# Patient Record
Sex: Female | Born: 1950 | Race: Black or African American | Hispanic: No | Marital: Married | Smoking: Never smoker
Health system: Southern US, Community
[De-identification: ages and names within clinical notes are randomized; demographics above are authoritative.]

## PROBLEM LIST (undated history)

## (undated) DIAGNOSIS — I10 Essential (primary) hypertension: Secondary | ICD-10-CM

## (undated) DIAGNOSIS — E785 Hyperlipidemia, unspecified: Secondary | ICD-10-CM

## (undated) DIAGNOSIS — E1140 Type 2 diabetes mellitus with diabetic neuropathy, unspecified: Secondary | ICD-10-CM

## (undated) DIAGNOSIS — M810 Age-related osteoporosis without current pathological fracture: Secondary | ICD-10-CM

## (undated) DIAGNOSIS — K8020 Calculus of gallbladder without cholecystitis without obstruction: Secondary | ICD-10-CM

## (undated) DIAGNOSIS — R002 Palpitations: Secondary | ICD-10-CM

## (undated) DIAGNOSIS — E119 Type 2 diabetes mellitus without complications: Secondary | ICD-10-CM

## (undated) HISTORY — PX: CHOLECYSTECTOMY: SHX55

## (undated) HISTORY — DX: Age-related osteoporosis without current pathological fracture: M81.0

## (undated) HISTORY — DX: Hyperlipidemia, unspecified: E78.5

## (undated) HISTORY — DX: Palpitations: R00.2

## (undated) HISTORY — DX: Type 2 diabetes mellitus without complications: E11.9

## (undated) HISTORY — DX: Type 2 diabetes mellitus with diabetic neuropathy, unspecified: E11.40

## (undated) HISTORY — DX: Essential (primary) hypertension: I10

## (undated) HISTORY — PX: TUBAL LIGATION: SHX77

---

## 1998-05-19 ENCOUNTER — Ambulatory Visit (HOSPITAL_COMMUNITY): Admission: RE | Admit: 1998-05-19 | Discharge: 1998-05-19 | Payer: Self-pay | Admitting: Family Medicine

## 1998-09-27 ENCOUNTER — Other Ambulatory Visit: Admission: RE | Admit: 1998-09-27 | Discharge: 1998-09-27 | Payer: Self-pay | Admitting: Family Medicine

## 2000-05-05 ENCOUNTER — Encounter: Admission: RE | Admit: 2000-05-05 | Discharge: 2000-05-05 | Payer: Self-pay

## 2003-03-19 ENCOUNTER — Emergency Department (HOSPITAL_COMMUNITY): Admission: AD | Admit: 2003-03-19 | Discharge: 2003-03-20 | Payer: Self-pay | Admitting: Emergency Medicine

## 2010-01-27 ENCOUNTER — Inpatient Hospital Stay (HOSPITAL_COMMUNITY)
Admission: EM | Admit: 2010-01-27 | Discharge: 2010-01-29 | Payer: Self-pay | Source: Home / Self Care | Attending: Cardiology | Admitting: Cardiology

## 2010-02-05 LAB — BASIC METABOLIC PANEL
BUN: 21 mg/dL (ref 6–23)
BUN: 10 mg/dL (ref 6–23)
CO2: 28 mEq/L (ref 19–32)
CO2: 27 mEq/L (ref 19–32)
Calcium: 8.7 mg/dL (ref 8.4–10.5)
Calcium: 8.7 mg/dL (ref 8.4–10.5)
Chloride: 108 mEq/L (ref 96–112)
Chloride: 110 mEq/L (ref 96–112)
Creatinine, Ser: 1.16 mg/dL (ref 0.4–1.2)
Creatinine, Ser: 1 mg/dL (ref 0.4–1.2)
GFR calc Af Amer: 58 mL/min — ABNORMAL LOW (ref >60)
GFR calc Af Amer: >60 mL/min (ref >60)
GFR calc non Af Amer: 48 mL/min — ABNORMAL LOW (ref >60)
GFR calc non Af Amer: 57 mL/min — ABNORMAL LOW (ref >60)
Glucose, Bld: 123 mg/dL — ABNORMAL HIGH (ref 70–99)
Glucose, Bld: 119 mg/dL — ABNORMAL HIGH (ref 70–99)
Potassium: 3.7 mEq/L (ref 3.5–5.1)
Potassium: 3.5 mEq/L (ref 3.5–5.1)
Sodium: 142 mEq/L (ref 135–145)
Sodium: 142 mEq/L (ref 135–145)

## 2010-02-05 LAB — CARDIAC PANEL(CRET KIN+CKTOT+MB+TROPI)
CK, MB: 1.6 ng/mL (ref 0.3–4.0)
CK, MB: 1.3 ng/mL (ref 0.3–4.0)
Relative Index: INVALID (ref 0.0–2.5)
Relative Index: INVALID (ref 0.0–2.5)
Total CK: 72 U/L (ref 7–177)
Total CK: 77 U/L (ref 7–177)
Troponin I: 0.16 ng/mL — ABNORMAL HIGH (ref 0.00–0.06)
Troponin I: 0.09 ng/mL — ABNORMAL HIGH (ref 0.00–0.06)

## 2010-02-05 LAB — CBC
HCT: 44.2 % (ref 36.0–46.0)
HCT: 39.8 % (ref 36.0–46.0)
HCT: 39.5 % (ref 36.0–46.0)
Hemoglobin: 14.5 g/dL (ref 12.0–15.0)
Hemoglobin: 12.7 g/dL (ref 12.0–15.0)
Hemoglobin: 12.9 g/dL (ref 12.0–15.0)
MCH: 28 pg (ref 26.0–34.0)
MCH: 27.7 pg (ref 26.0–34.0)
MCH: 27.9 pg (ref 26.0–34.0)
MCHC: 32.8 g/dL (ref 30.0–36.0)
MCHC: 31.9 g/dL (ref 30.0–36.0)
MCHC: 32.7 g/dL (ref 30.0–36.0)
MCV: 85.3 fL (ref 78.0–100.0)
MCV: 86.9 fL (ref 78.0–100.0)
MCV: 85.5 fL (ref 78.0–100.0)
Platelets: 238 10*3/uL (ref 150–400)
Platelets: 203 10*3/uL (ref 150–400)
Platelets: 203 10*3/uL (ref 150–400)
RBC: 5.18 MIL/uL — ABNORMAL HIGH (ref 3.87–5.11)
RBC: 4.58 MIL/uL (ref 3.87–5.11)
RBC: 4.62 MIL/uL (ref 3.87–5.11)
RDW: 12.9 % (ref 11.5–15.5)
RDW: 12.9 % (ref 11.5–15.5)
RDW: 12.8 % (ref 11.5–15.5)
WBC: 9.8 10*3/uL (ref 4.0–10.5)
WBC: 9 10*3/uL (ref 4.0–10.5)
WBC: 8.5 10*3/uL (ref 4.0–10.5)

## 2010-02-05 LAB — COMPREHENSIVE METABOLIC PANEL
ALT: 23 U/L (ref 0–35)
AST: 26 U/L (ref 0–37)
Albumin: 3.8 g/dL (ref 3.5–5.2)
Alkaline Phosphatase: 69 U/L (ref 39–117)
BUN: 33 mg/dL — ABNORMAL HIGH (ref 6–23)
CO2: 27 mEq/L (ref 19–32)
Calcium: 9.1 mg/dL (ref 8.4–10.5)
Chloride: 100 mEq/L (ref 96–112)
Creatinine, Ser: 1.41 mg/dL — ABNORMAL HIGH (ref 0.4–1.2)
GFR calc Af Amer: 47 mL/min — ABNORMAL LOW (ref >60)
GFR calc non Af Amer: 38 mL/min — ABNORMAL LOW (ref >60)
Glucose, Bld: 163 mg/dL — ABNORMAL HIGH (ref 70–99)
Potassium: 3.6 mEq/L (ref 3.5–5.1)
Sodium: 134 mEq/L — ABNORMAL LOW (ref 135–145)
Total Bilirubin: 0.6 mg/dL (ref 0.3–1.2)
Total Protein: 7.1 g/dL (ref 6.0–8.3)

## 2010-02-05 LAB — APTT: aPTT: 26 seconds (ref 24–37)

## 2010-02-05 LAB — DIFFERENTIAL
Basophils Absolute: 0.1 10*3/uL (ref 0.0–0.1)
Basophils Relative: 1 % (ref 0–1)
Eosinophils Absolute: 0.3 10*3/uL (ref 0.0–0.7)
Eosinophils Relative: 3 % (ref 0–5)
Lymphocytes Relative: 19 % (ref 12–46)
Lymphs Abs: 1.9 10*3/uL (ref 0.7–4.0)
Monocytes Absolute: 0.8 10*3/uL (ref 0.1–1.0)
Monocytes Relative: 8 % (ref 3–12)
Neutro Abs: 6.8 10*3/uL (ref 1.7–7.7)
Neutrophils Relative %: 69 % (ref 43–77)

## 2010-02-05 LAB — LIPASE, BLOOD: Lipase: 50 U/L (ref 11–59)

## 2010-02-05 LAB — HEPARIN LEVEL (UNFRACTIONATED)
Heparin Unfractionated: 0.14 IU/mL — ABNORMAL LOW (ref 0.30–0.70)
Heparin Unfractionated: 0.36 IU/mL (ref 0.30–0.70)
Heparin Unfractionated: 0.35 IU/mL (ref 0.30–0.70)
Heparin Unfractionated: 0.32 IU/mL (ref 0.30–0.70)

## 2010-02-05 LAB — BRAIN NATRIURETIC PEPTIDE: Pro B Natriuretic peptide (BNP): <30 pg/mL (ref 0.0–100.0)

## 2010-02-05 LAB — CK TOTAL AND CKMB (NOT AT ARMC)
CK, MB: 1.1 ng/mL (ref 0.3–4.0)
Relative Index: INVALID (ref 0.0–2.5)
Total CK: 62 U/L (ref 7–177)

## 2010-02-05 LAB — PROTIME-INR
INR: 0.98 (ref 0.00–1.49)
Prothrombin Time: 13.2 seconds (ref 11.6–15.2)

## 2010-02-05 LAB — GLUCOSE, CAPILLARY
Glucose-Capillary: 115 mg/dL — ABNORMAL HIGH (ref 70–99)
Glucose-Capillary: 118 mg/dL — ABNORMAL HIGH (ref 70–99)

## 2010-02-05 LAB — TROPONIN I: Troponin I: 0.12 ng/mL — ABNORMAL HIGH (ref 0.00–0.06)

## 2010-02-12 NOTE — Discharge Summary (Addendum)
NAME:  Stefanie Erickson, Stefanie Erickson             ACCOUNT NO.:  [id number]  MEDICAL RECORD NO.:  [id number]          PATIENT TYPE:  INP  LOCATION:  3706                         FACILITY:  [facility]  PHYSICIAN:  Noralyn Pick. Eden Emms, MD, FACCDATE OF BIRTH:  [birth year]  DATE OF ADMISSION:  01/27/2010 DATE OF DISCHARGE:  01/29/2010                              DISCHARGE SUMMARY   PRIMARY CARDIOLOGIST:  Madolyn Frieze. Jens Som, MD, [facility]  PRIMARY CARE:  [facility] in [place].  DISCHARGE DIAGNOSIS:  Chest pain with troponin elevation.  SECONDARY DIAGNOSES: 1. Hypertension. 2. Hyperlipidemia. 3. Obesity. 4. Mild renal insufficiency on admission. 5. Mild hyperglycemia. 6. History of peptic ulcer disease. 7. Status post cholecystectomy. 8. Status post appendectomy.  ALLERGIES:  No known drug allergies.  PROCEDURES:  Left heart cardiac catheterization revealing normal coronary arteries and LVEF of 65%.  HISTORY OF PRESENT ILLNESS:  A 60 year old African American female without prior cardiac history who was recently admitted and subsequently discharged from [facility] in late December 2011 secondary to hypertension.  She is subsequently placed on lisinopril/hydrochlorothiazide therapy as well as Pravachol after an unrevealing hospital admission.  On the day of admission, the patient had an episode of nausea with vomiting and subsequently chest discomfort radiating to her back.  She presented to the [facility] where she was hypertensive and given IV hydralazine and subsequently morphine with relief of chest discomfort.  She was found to have an elevated troponin of 0.12 and she was transferred to [facility] for further evaluation.  HOSPITAL COURSE:  The patient's troponin peaked at 0.16, but CKs and MBs remained normal.  She was also found to have an elevated creatinine of 1.41.  As a result, her lisinopril and hydrochlorothiazide were held. With hydration, BUN and creatinine  improved.  Given troponin elevation, decision was made to pursue cardiac catheterization, which took place this morning showing normal coronary arteries with normal LV function with an EF of 65%.  This obviously makes the likelihood of acute coronary syndrome much less likely.  Stefanie Erickson will be discharged home today in good condition.  As she does have a history of hypertension and as we have discontinued her lisinopril and hydrochlorothiazide, we have initiated beta-blocker therapy.  She will follow up in [facility] in the next few weeks.  DISCHARGE LABS:  Hemoglobin 12.9, hematocrit 39.5, WBC 8.5, platelets 203.  Sodium 142, potassium 3.5, chloride 110, CO2 of 27, BUN 10, creatinine 1.0, glucose 119, total bilirubin 0.6, alkaline phosphatase 69, AST 27, ALT 23, total protein 7.1, albumin 3.8, calcium 8.7, lipase 50, CK 77, MB 1.2, troponin-I 0.09.  DISPOSITION:  The patient will be discharged home today in good condition.  FOLLOWUP PLANS AND APPOINTMENTS:  The patient will follow up in [facility] in 3-4 weeks.  DISCHARGE MEDICATIONS: 1. Metoprolol 25 mg b.i.d. 2. Aspirin 81 mg daily. 3. Pravachol 40 mg at bedtime.  OUTSTANDING LABS AND STUDIES:  None.  Duration of discharge encounter 35 minutes including physician time.     Nicolasa Ducking, ANP   ______________________________ Noralyn Pick. Eden Emms, MD, [facility]    CB/MEDQ  D:  01/29/2010  T:  01/30/2010  Job:  [id number]  cc:   [facility] in [place]  Electronically Signed by Nicolasa Ducking ANP on 02/12/2010 03:46:39 PM Electronically Signed by Charlton Haws MD [facility] on 02/12/2010 05:29:16 PM

## 2010-05-28 ENCOUNTER — Other Ambulatory Visit: Payer: Self-pay | Admitting: *Deleted

## 2010-05-28 ENCOUNTER — Telehealth: Payer: Self-pay | Admitting: Cardiovascular Disease

## 2010-05-28 MED ORDER — METOPROLOL TARTRATE 25 MG PO TABS: 25.0000 mg | ORAL_TABLET | Freq: Two times a day (BID) | ORAL | Status: AC

## 2010-05-28 MED ORDER — METOPROLOL TARTRATE 25 MG PO TABS: 25.0000 mg | ORAL_TABLET | Freq: Two times a day (BID) | ORAL | Status: DC

## 2010-05-28 NOTE — Telephone Encounter (Signed)
Pt's husband called back and med not called in yet, told him it's a 48 hour turn a round time, said she's out and needs today

## 2010-05-28 NOTE — Telephone Encounter (Signed)
Pt needs metoprolol 25mg  to be called [facility] # [phone]

## 2012-08-27 ENCOUNTER — Ambulatory Visit: Payer: Self-pay

## 2012-11-20 ENCOUNTER — Encounter (HOSPITAL_COMMUNITY): Payer: Self-pay | Admitting: Emergency Medicine

## 2012-11-20 ENCOUNTER — Emergency Department (HOSPITAL_COMMUNITY)
Admission: EM | Admit: 2012-11-20 | Discharge: 2012-11-21 | Disposition: A | Discharge status: Home / Self Care | Payer: Self-pay | Attending: Emergency Medicine | Admitting: Emergency Medicine

## 2012-11-20 DIAGNOSIS — I10 Essential (primary) hypertension: Secondary | ICD-10-CM | POA: Insufficient documentation

## 2012-11-20 DIAGNOSIS — R51 Headache: Secondary | ICD-10-CM | POA: Insufficient documentation

## 2012-11-20 DIAGNOSIS — Z79899 Other long term (current) drug therapy: Secondary | ICD-10-CM | POA: Insufficient documentation

## 2012-11-20 DIAGNOSIS — R739 Hyperglycemia, unspecified: Secondary | ICD-10-CM

## 2012-11-20 DIAGNOSIS — Z8719 Personal history of other diseases of the digestive system: Secondary | ICD-10-CM | POA: Insufficient documentation

## 2012-11-20 DIAGNOSIS — R350 Frequency of micturition: Secondary | ICD-10-CM | POA: Insufficient documentation

## 2012-11-20 DIAGNOSIS — R42 Dizziness and giddiness: Secondary | ICD-10-CM | POA: Insufficient documentation

## 2012-11-20 DIAGNOSIS — R631 Polydipsia: Secondary | ICD-10-CM | POA: Insufficient documentation

## 2012-11-20 DIAGNOSIS — Z7982 Long term (current) use of aspirin: Secondary | ICD-10-CM | POA: Insufficient documentation

## 2012-11-20 DIAGNOSIS — E119 Type 2 diabetes mellitus without complications: Secondary | ICD-10-CM | POA: Insufficient documentation

## 2012-11-20 HISTORY — DX: Calculus of gallbladder without cholecystitis without obstruction: K80.20

## 2012-11-20 HISTORY — DX: Essential (primary) hypertension: I10

## 2012-11-20 LAB — CBC WITH DIFFERENTIAL/PLATELET
Basophils Absolute: 0.1 10*3/uL (ref 0.0–0.1)
Basophils Relative: 1 % (ref 0–1)
Eosinophils Relative: 4 % (ref 0–5)
HCT: 41.2 % (ref 36.0–46.0)
Hemoglobin: 14.2 g/dL (ref 12.0–15.0)
Lymphocytes Relative: 32 % (ref 12–46)
MCHC: 34.5 g/dL (ref 30.0–36.0)
MCV: 85.1 fL (ref 78.0–100.0)
Monocytes Absolute: 0.6 10*3/uL (ref 0.1–1.0)
Monocytes Relative: 6 % (ref 3–12)
Neutro Abs: 5.6 10*3/uL (ref 1.7–7.7)
RDW: 13.2 % (ref 11.5–15.5)
WBC: 9.7 10*3/uL (ref 4.0–10.5)

## 2012-11-20 LAB — COMPREHENSIVE METABOLIC PANEL
AST: 21 U/L (ref 0–37)
BUN: 17 mg/dL (ref 6–23)
CO2: 26 mEq/L (ref 19–32)
Calcium: 9.4 mg/dL (ref 8.4–10.5)
Chloride: 102 mEq/L (ref 96–112)
Creatinine, Ser: 1.01 mg/dL (ref 0.50–1.10)
GFR calc Af Amer: 69 mL/min — ABNORMAL LOW (ref >90)
GFR calc non Af Amer: 59 mL/min — ABNORMAL LOW (ref >90)
Glucose, Bld: 356 mg/dL — ABNORMAL HIGH (ref 70–99)
Total Bilirubin: 0.3 mg/dL (ref 0.3–1.2)

## 2012-11-20 LAB — URINALYSIS, ROUTINE W REFLEX MICROSCOPIC
Bilirubin Urine: NEGATIVE
Glucose, UA: >1000 mg/dL — AB
Hgb urine dipstick: NEGATIVE
Ketones, ur: NEGATIVE mg/dL
Protein, ur: NEGATIVE mg/dL
pH: 5 (ref 5.0–8.0)

## 2012-11-20 LAB — GLUCOSE, CAPILLARY: Glucose-Capillary: 341 mg/dL — ABNORMAL HIGH (ref 70–99)

## 2012-11-20 LAB — URINE MICROSCOPIC-ADD ON

## 2012-11-20 MED ORDER — METFORMIN HCL 500 MG PO TABS: 500.0000 mg | ORAL_TABLET | Freq: Two times a day (BID) | ORAL | Status: DC

## 2012-11-20 MED ORDER — METFORMIN HCL 500 MG PO TABS
500.0000 mg | ORAL_TABLET | Freq: Once | ORAL | Status: AC
  Administered 2012-11-20: 500 mg via ORAL
  Filled 2012-11-20: qty 1

## 2012-11-20 MED ORDER — SODIUM CHLORIDE 0.9 % IV BOLUS (SEPSIS)
1000.0000 mL | Freq: Once | INTRAVENOUS | Status: AC
  Administered 2012-11-20: 1000 mL via INTRAVENOUS

## 2012-11-20 NOTE — ED Provider Notes (Signed)
CSN: [id number]     Arrival date & time 11/20/12  2154 History   First MD Initiated Contact with Patient 11/20/12 2305     Chief Complaint  Patient presents with  . Hyperglycemia   (Consider location/radiation/quality/duration/timing/severity/associated sxs/prior Treatment) Patient is a 62 y.o. female presenting with hyperglycemia. The history is provided by the patient.  Hyperglycemia She has noticed that for the last 2 days, she has had polyuria and polydipsia we'll with some dizziness and a mild bifrontal headache. Headache is dull and throbbing and she rates pain at 3/10. Mouth has been dry. There's been no nausea or vomiting. She denies fever or chills. Her husband is diabetic and checked her sugar and noted it was over 400 so she came to the ED. She's never had an elevated blood sugar before that she is aware of.  Past Medical History  Diagnosis Date  . Hypertension   . Gall stones    Past Surgical History  Procedure Laterality Date  . Cholecystectomy    . Tubal ligation     No family history on file. History  Substance Use Topics  . Smoking status: Never Smoker   . Smokeless tobacco: Not on file  . Alcohol Use: No   OB History   Grav Para Term Preterm Abortions TAB SAB Ect Mult Living                 Review of Systems  All other systems reviewed and are negative.    Allergies  Review of patient's allergies indicates no known allergies.  Home Medications   Current Outpatient Rx  Name  Route  Sig  Dispense  Refill  . aspirin 81 MG chewable tablet   Oral   Chew 81 mg by mouth daily.         Marland Kitchen LISINOPRIL PO   Oral   Take by mouth.         Marland Kitchen PRAVASTATIN SODIUM PO   Oral   Take 1 tablet by mouth daily.          BP 151/83  Pulse 100  Temp(Src) 98.4 F (36.9 C) (Oral)  Resp 18  SpO2 98% Physical Exam  Nursing note and vitals reviewed.  62 year old female, resting comfortably and in no acute distress. Vital signs are significant for  hypertension with blood pressure 151/83. Oxygen saturation is 98%, which is normal. Head is normocephalic and atraumatic. PERRLA, EOMI. Oropharynx is clear. Fundi show no hemorrhage, exudate, or papilledema. Neck is nontender and supple without adenopathy or JVD. Back is nontender and there is no CVA tenderness. Lungs are clear without rales, wheezes, or rhonchi. Chest is nontender. Heart has regular rate and rhythm without murmur. Abdomen is soft, flat, nontender without masses or hepatosplenomegaly and peristalsis is normoactive. Extremities have no cyanosis or edema, full range of motion is present. Skin is warm and dry without rash. Neurologic: Mental status is normal, cranial nerves are intact, there are no motor or sensory deficits.  ED Course  Procedures (including critical care time) Labs Review Results for orders placed during the hospital encounter of 11/20/12  GLUCOSE, CAPILLARY      Result Value Range   Glucose-Capillary 341 (*) 70 - 99 mg/dL   Comment 1 Notify RN     Comment 2 Documented in Chart    URINALYSIS, ROUTINE W REFLEX MICROSCOPIC      Result Value Range   Color, Urine YELLOW  YELLOW   APPearance CLEAR  CLEAR   Specific  Gravity, Urine 1.035 (*) 1.005 - 1.030   pH 5.0  5.0 - 8.0   Glucose, UA >1000 (*) NEGATIVE mg/dL   Hgb urine dipstick NEGATIVE  NEGATIVE   Bilirubin Urine NEGATIVE  NEGATIVE   Ketones, ur NEGATIVE  NEGATIVE mg/dL   Protein, ur NEGATIVE  NEGATIVE mg/dL   Urobilinogen, UA 0.2  0.0 - 1.0 mg/dL   Nitrite NEGATIVE  NEGATIVE   Leukocytes, UA NEGATIVE  NEGATIVE  CBC WITH DIFFERENTIAL      Result Value Range   WBC 9.7  4.0 - 10.5 K/uL   RBC 4.84  3.87 - 5.11 MIL/uL   Hemoglobin 14.2  12.0 - 15.0 g/dL   HCT 16.1  09.6 - 04.5 %   MCV 85.1  78.0 - 100.0 fL   MCH 29.3  26.0 - 34.0 pg   MCHC 34.5  30.0 - 36.0 g/dL   RDW 40.9  81.1 - 91.4 %   Platelets 218  150 - 400 K/uL   Neutrophils Relative % 58  43 - 77 %   Neutro Abs 5.6  1.7 - 7.7 K/uL    Lymphocytes Relative 32  12 - 46 %   Lymphs Abs 3.1  0.7 - 4.0 K/uL   Monocytes Relative 6  3 - 12 %   Monocytes Absolute 0.6  0.1 - 1.0 K/uL   Eosinophils Relative 4  0 - 5 %   Eosinophils Absolute 0.4  0.0 - 0.7 K/uL   Basophils Relative 1  0 - 1 %   Basophils Absolute 0.1  0.0 - 0.1 K/uL  COMPREHENSIVE METABOLIC PANEL      Result Value Range   Sodium 137  135 - 145 mEq/L   Potassium 4.1  3.5 - 5.1 mEq/L   Chloride 102  96 - 112 mEq/L   CO2 26  19 - 32 mEq/L   Glucose, Bld 356 (*) 70 - 99 mg/dL   BUN 17  6 - 23 mg/dL   Creatinine, Ser 7.82  0.50 - 1.10 mg/dL   Calcium 9.4  8.4 - 95.6 mg/dL   Total Protein 7.2  6.0 - 8.3 g/dL   Albumin 3.9  3.5 - 5.2 g/dL   AST 21  0 - 37 U/L   ALT 31  0 - 35 U/L   Alkaline Phosphatase 65  39 - 117 U/L   Total Bilirubin 0.3  0.3 - 1.2 mg/dL   GFR calc non Af Amer 59 (*) >90 mL/min   GFR calc Af Amer 69 (*) >90 mL/min  URINE MICROSCOPIC-ADD ON      Result Value Range   Squamous Epithelial / LPF RARE  RARE   WBC, UA 0-2  <3 WBC/hpf   RBC / HPF 0-2  <3 RBC/hpf   Bacteria, UA RARE  RARE   MDM   1. Hyperglycemia    Hyperglycemia which is moderately severe. No evidence of ketoacidosis. Bicarbonate is normal and anion gap is normal. She'll need to be started on oral hypoglycemic therapy. She's given IV fluid bolus in the ED and is given initial dose of metformin and she is discharged with a prescription for metformin. Patient states that she has no PCP, so she is given resource guide. She states [place] she is advised that she will need to find out about local resources from the hospital in [place].    Dione Booze, MD 11/20/12 2329

## 2012-11-20 NOTE — ED Notes (Signed)
Pt. reports elevated blood sugar this evening - 447 , headache with dizziness , dry mouth and urinary frequency.

## 2017-12-17 DIAGNOSIS — Z79899 Other long term (current) drug therapy: Secondary | ICD-10-CM | POA: Diagnosis not present

## 2017-12-17 DIAGNOSIS — Z1211 Encounter for screening for malignant neoplasm of colon: Secondary | ICD-10-CM | POA: Diagnosis not present

## 2017-12-17 DIAGNOSIS — Z0000 Encounter for general adult medical examination without abnormal findings: Secondary | ICD-10-CM | POA: Diagnosis not present

## 2017-12-17 DIAGNOSIS — Z1159 Encounter for screening for other viral diseases: Secondary | ICD-10-CM | POA: Diagnosis not present

## 2017-12-17 DIAGNOSIS — E119 Type 2 diabetes mellitus without complications: Secondary | ICD-10-CM | POA: Diagnosis not present

## 2017-12-17 DIAGNOSIS — Z136 Encounter for screening for cardiovascular disorders: Secondary | ICD-10-CM | POA: Diagnosis not present

## 2017-12-17 DIAGNOSIS — Z9181 History of falling: Secondary | ICD-10-CM | POA: Diagnosis not present

## 2017-12-17 DIAGNOSIS — Z124 Encounter for screening for malignant neoplasm of cervix: Secondary | ICD-10-CM | POA: Diagnosis not present

## 2017-12-17 DIAGNOSIS — Z1239 Encounter for other screening for malignant neoplasm of breast: Secondary | ICD-10-CM | POA: Diagnosis not present

## 2017-12-17 DIAGNOSIS — Z23 Encounter for immunization: Secondary | ICD-10-CM | POA: Diagnosis not present

## 2017-12-17 DIAGNOSIS — I10 Essential (primary) hypertension: Secondary | ICD-10-CM | POA: Diagnosis not present

## 2017-12-17 DIAGNOSIS — E785 Hyperlipidemia, unspecified: Secondary | ICD-10-CM | POA: Diagnosis not present

## 2017-12-17 DIAGNOSIS — G629 Polyneuropathy, unspecified: Secondary | ICD-10-CM | POA: Diagnosis not present

## 2017-12-17 DIAGNOSIS — Z1331 Encounter for screening for depression: Secondary | ICD-10-CM | POA: Diagnosis not present

## 2018-01-28 DIAGNOSIS — M8588 Other specified disorders of bone density and structure, other site: Secondary | ICD-10-CM | POA: Diagnosis not present

## 2018-01-28 DIAGNOSIS — M8580 Other specified disorders of bone density and structure, unspecified site: Secondary | ICD-10-CM | POA: Diagnosis not present

## 2018-01-28 DIAGNOSIS — N959 Unspecified menopausal and perimenopausal disorder: Secondary | ICD-10-CM | POA: Diagnosis not present

## 2018-02-18 DIAGNOSIS — D120 Benign neoplasm of cecum: Secondary | ICD-10-CM | POA: Diagnosis not present

## 2018-02-18 DIAGNOSIS — Z800 Family history of malignant neoplasm of digestive organs: Secondary | ICD-10-CM | POA: Diagnosis not present

## 2018-02-18 DIAGNOSIS — Z1211 Encounter for screening for malignant neoplasm of colon: Secondary | ICD-10-CM | POA: Diagnosis not present

## 2018-02-18 DIAGNOSIS — K635 Polyp of colon: Secondary | ICD-10-CM | POA: Diagnosis not present

## 2018-03-19 DIAGNOSIS — Z1231 Encounter for screening mammogram for malignant neoplasm of breast: Secondary | ICD-10-CM | POA: Diagnosis not present

## 2018-03-19 DIAGNOSIS — Z0100 Encounter for examination of eyes and vision without abnormal findings: Secondary | ICD-10-CM | POA: Diagnosis not present

## 2018-03-19 DIAGNOSIS — E663 Overweight: Secondary | ICD-10-CM | POA: Diagnosis not present

## 2018-03-19 DIAGNOSIS — E119 Type 2 diabetes mellitus without complications: Secondary | ICD-10-CM | POA: Diagnosis not present

## 2018-03-24 DIAGNOSIS — R0602 Shortness of breath: Secondary | ICD-10-CM | POA: Diagnosis not present

## 2018-03-24 DIAGNOSIS — G4733 Obstructive sleep apnea (adult) (pediatric): Secondary | ICD-10-CM | POA: Diagnosis not present

## 2018-03-25 DIAGNOSIS — G4733 Obstructive sleep apnea (adult) (pediatric): Secondary | ICD-10-CM | POA: Diagnosis not present

## 2018-03-25 DIAGNOSIS — R0602 Shortness of breath: Secondary | ICD-10-CM | POA: Diagnosis not present

## 2018-04-09 DIAGNOSIS — Z1231 Encounter for screening mammogram for malignant neoplasm of breast: Secondary | ICD-10-CM | POA: Diagnosis not present

## 2018-09-17 DIAGNOSIS — Z23 Encounter for immunization: Secondary | ICD-10-CM | POA: Diagnosis not present

## 2018-09-22 DIAGNOSIS — I10 Essential (primary) hypertension: Secondary | ICD-10-CM | POA: Diagnosis not present

## 2018-09-22 DIAGNOSIS — E118 Type 2 diabetes mellitus with unspecified complications: Secondary | ICD-10-CM | POA: Diagnosis not present

## 2018-09-22 DIAGNOSIS — Z139 Encounter for screening, unspecified: Secondary | ICD-10-CM | POA: Diagnosis not present

## 2018-09-22 DIAGNOSIS — E1165 Type 2 diabetes mellitus with hyperglycemia: Secondary | ICD-10-CM | POA: Diagnosis not present

## 2018-09-22 DIAGNOSIS — R079 Chest pain, unspecified: Secondary | ICD-10-CM | POA: Diagnosis not present

## 2018-09-25 ENCOUNTER — Other Ambulatory Visit: Payer: Self-pay

## 2018-09-25 ENCOUNTER — Encounter: Payer: Self-pay | Admitting: Cardiology

## 2018-09-25 ENCOUNTER — Ambulatory Visit (INDEPENDENT_AMBULATORY_CARE_PROVIDER_SITE_OTHER): Payer: Medicare Other | Admitting: Cardiology

## 2018-09-25 DIAGNOSIS — R002 Palpitations: Secondary | ICD-10-CM | POA: Diagnosis not present

## 2018-09-25 DIAGNOSIS — R0789 Other chest pain: Secondary | ICD-10-CM | POA: Diagnosis not present

## 2018-09-25 DIAGNOSIS — E119 Type 2 diabetes mellitus without complications: Secondary | ICD-10-CM | POA: Insufficient documentation

## 2018-09-25 DIAGNOSIS — I10 Essential (primary) hypertension: Secondary | ICD-10-CM | POA: Insufficient documentation

## 2018-09-25 DIAGNOSIS — R0609 Other forms of dyspnea: Secondary | ICD-10-CM | POA: Insufficient documentation

## 2018-09-25 HISTORY — DX: Other forms of dyspnea: R06.09

## 2018-09-25 HISTORY — DX: Palpitations: R00.2

## 2018-09-25 HISTORY — DX: Essential (primary) hypertension: I10

## 2018-09-25 HISTORY — DX: Type 2 diabetes mellitus without complications: E11.9

## 2018-09-25 HISTORY — DX: Other chest pain: R07.89

## 2018-09-25 NOTE — Patient Instructions (Signed)
Medication Instructions:  Your physician recommends that you continue on your current medications as directed. Please refer to the Current Medication list given to you today.  If you need a refill on your cardiac medications before your next appointment, please call your pharmacy.   Lab work: Your physician recommends that you return for lab work today: BNP, BMP, TSH  If you have labs (blood work) drawn today and your tests are completely normal, you will receive your results only by: Marland Kitchen MyChart Message (if you have MyChart) OR . A paper copy in the mail If you have any lab test that is abnormal or we need to change your treatment, we will call you to review the results.  Testing/Procedures: Your physician has requested that you have an echocardiogram. Echocardiography is a painless test that uses sound waves to create images of your heart. It provides your doctor with information about the size and shape of your heart and how well your heart's chambers and valves are working. This procedure takes approximately one hour. There are no restrictions for this procedure.  Your physician has requested that you have a lexiscan myoview. For further information please visit [email or web address]. Please follow instruction sheet, as given.    Follow-Up: At [facility], you and your health needs are our priority.  As part of our continuing mission to provide you with exceptional heart care, we have created designated Provider Care Teams.  These Care Teams include your primary Cardiologist (physician) and Advanced Practice Providers (APPs -  Physician Assistants and Nurse Practitioners) who all work together to provide you with the care you need, when you need it. You will need a follow up appointment in 1 months.  Please call our office 2 months in advance to schedule this appointment.  You may see No primary care provider on file. or another member of our [employer] Provider Team in [place]: Shirlee More, MD . Jyl Heinz, MD  Any Other Special Instructions Will Be Listed Below (If Applicable).   Echocardiogram An echocardiogram is a procedure that uses painless sound waves (ultrasound) to produce an image of the heart. Images from an echocardiogram can provide important information about:  Signs of coronary artery disease (CAD).  Aneurysm detection. An aneurysm is a weak or damaged part of an artery wall that bulges out from the normal force of blood pumping through the body.  Heart size and shape. Changes in the size or shape of the heart can be associated with certain conditions, including heart failure, aneurysm, and CAD.  Heart muscle function.  Heart valve function.  Signs of a past heart attack.  Fluid buildup around the heart.  Thickening of the heart muscle.  A tumor or infectious growth around the heart valves. Tell a health care provider about:  Any allergies you have.  All medicines you are taking, including vitamins, herbs, eye drops, creams, and over-the-counter medicines.  Any blood disorders you have.  Any surgeries you have had.  Any medical conditions you have.  Whether you are pregnant or may be pregnant. What are the risks? Generally, this is a safe procedure. However, problems may occur, including:  Allergic reaction to dye (contrast) that may be used during the procedure. What happens before the procedure? No specific preparation is needed. You may eat and drink normally. What happens during the procedure?   An IV tube may be inserted into one of your veins.  You may receive contrast through this tube. A contrast is an injection  that improves the quality of the pictures from your heart.  A gel will be applied to your chest.  A wand-like tool (transducer) will be moved over your chest. The gel will help to transmit the sound waves from the transducer.  The sound waves will harmlessly bounce off of your heart to allow the heart  images to be captured in real-time motion. The images will be recorded on a computer. The procedure may vary among health care providers and hospitals. What happens after the procedure?  You may return to your normal, everyday life, including diet, activities, and medicines, unless your health care provider tells you not to do that. Summary  An echocardiogram is a procedure that uses painless sound waves (ultrasound) to produce an image of the heart.  Images from an echocardiogram can provide important information about the size and shape of your heart, heart muscle function, heart valve function, and fluid buildup around your heart.  You do not need to do anything to prepare before this procedure. You may eat and drink normally.  After the echocardiogram is completed, you may return to your normal, everyday life, unless your health care provider tells you not to do that. This information is not intended to replace advice given to you by your health care provider. Make sure you discuss any questions you have with your health care provider. Document Released: 01/05/2000 Document Revised: 04/30/2018 Document Reviewed: 02/10/2016 Elsevier Patient Education  2020 [employer].    Cardiac Nuclear Scan A cardiac nuclear scan is a test that measures blood flow to the heart when a person is resting and when he or she is exercising. The test looks for problems such as:  Not enough blood reaching a portion of the heart.  The heart muscle not working normally. You may need this test if:  You have heart disease.  You have had abnormal lab results.  You have had heart surgery or a balloon procedure to open up blocked arteries (angioplasty).  You have chest pain.  You have shortness of breath. In this test, a radioactive dye (tracer) is injected into your bloodstream. After the tracer has traveled to your heart, an imaging device is used to measure how much of the tracer is absorbed by or  distributed to various areas of your heart. This procedure is usually done at a hospital and takes 2-4 hours. Tell a health care provider about:  Any allergies you have.  All medicines you are taking, including vitamins, herbs, eye drops, creams, and over-the-counter medicines.  Any problems you or family members have had with anesthetic medicines.  Any blood disorders you have.  Any surgeries you have had.  Any medical conditions you have.  Whether you are pregnant or may be pregnant. What are the risks? Generally, this is a safe procedure. However, problems may occur, including:  Serious chest pain and heart attack. This is only a risk if the stress portion of the test is done.  Rapid heartbeat.  Sensation of warmth in your chest. This usually passes quickly.  Allergic reaction to the tracer. What happens before the procedure?  Ask your health care provider about changing or stopping your regular medicines. This is especially important if you are taking diabetes medicines or blood thinners.  Follow instructions from your health care provider about eating or drinking restrictions.  Remove your jewelry on the day of the procedure. What happens during the procedure?  An IV will be inserted into one of your veins.  Your health care provider will inject a small amount of radioactive tracer through the IV.  You will wait for 20-40 minutes while the tracer travels through your bloodstream.  Your heart activity will be monitored with an electrocardiogram (ECG).  You will lie down on an exam table.  Images of your heart will be taken for about 15-20 minutes.  You may also have a stress test. For this test, one of the following may be done: ? You will exercise on a treadmill or stationary bike. While you exercise, your heart's activity will be monitored with an ECG, and your blood pressure will be checked. ? You will be given medicines that will increase blood flow to parts  of your heart. This is done if you are unable to exercise.  When blood flow to your heart has peaked, a tracer will again be injected through the IV.  After 20-40 minutes, you will get back on the exam table and have more images taken of your heart.  Depending on the type of tracer used, scans may need to be repeated 3-4 hours later.  Your IV line will be removed when the procedure is over. The procedure may vary among health care providers and hospitals. What happens after the procedure?  Unless your health care provider tells you otherwise, you may return to your normal schedule, including diet, activities, and medicines.  Unless your health care provider tells you otherwise, you may increase your fluid intake. This will help to flush the contrast dye from your body. Drink enough fluid to keep your urine pale yellow.  Ask your health care provider, or the department that is doing the test: ? When will my results be ready? ? How will I get my results? Summary  A cardiac nuclear scan measures the blood flow to the heart when a person is resting and when he or she is exercising.  Tell your health care provider if you are pregnant.  Before the procedure, ask your health care provider about changing or stopping your regular medicines. This is especially important if you are taking diabetes medicines or blood thinners.  After the procedure, unless your health care provider tells you otherwise, increase your fluid intake. This will help flush the contrast dye from your body.  After the procedure, unless your health care provider tells you otherwise, you may return to your normal schedule, including diet, activities, and medicines. This information is not intended to replace advice given to you by your health care provider. Make sure you discuss any questions you have with your health care provider. Document Released: 02/02/2004 Document Revised: 06/23/2017 Document Reviewed: 06/23/2017  Elsevier Patient Education  2020 [employer].

## 2018-09-25 NOTE — Progress Notes (Signed)
Cardiology Consultation:    Date:  09/25/2018   ID:  Stefanie Erickson, DOB July 23, 1950, MRN [id number]  PCP:  Cyndi Bender, PA-C  Cardiologist:  Jenne Campus, MD   Referring MD: Cyndi Bender, PA-C   Chief Complaint  Patient presents with  . Chest Pain    "pretty good while"  . Shortness of Breath    History of Present Illness:    Stefanie Erickson is a 68 y.o. female who is being seen today for the evaluation of shortness of breath at the request of Cyndi Bender, Hershal Coria.  For about a year she experienced gradual exertional shortness of breath.  She used to be able to walk and climb stairs with no difficulty now she has to stop because of shortness of breath and fatigue.  She eliminated a lot of activities that she used to do because of shortness of breath she used to like to go to school however does not go there because of shortness of breath.  Denies having any chest pain tightness squeezing pressure burning in the chest when she walks just fatigue tiredness and shortness of breath.  She described also the fact that she sleeps with 6 pillows because laying flat give her shortness of breath.  There is no swelling of lower extremities.  She has to get up many times during the night to urinate. She is diabetic and she admits that she does not keep up with diet and her diabetes is poorly controlled. She does have hypertension but that appears to be well controlled. She never smoked She does have family history of coronary artery disease but not premature.  Past Medical History:  Diagnosis Date  . Diabetes mellitus without complication ([place])   . Gall stones   . Hyperlipidemia   . Hypertension     Past Surgical History:  Procedure Laterality Date  . CHOLECYSTECTOMY    . TUBAL LIGATION      Current Medications: Current Meds  Medication Sig  . aspirin 81 MG chewable tablet Chew 81 mg by mouth daily.  Marland Kitchen gabapentin (NEURONTIN) 100 MG capsule Take 1 capsule by mouth at  bedtime.  Marland Kitchen glipiZIDE (GLUCOTROL XL) 5 MG 24 hr tablet Take 1 tablet by mouth daily.  Marland Kitchen lisinopril-hydrochlorothiazide (ZESTORETIC) 20-25 MG tablet Take 1 tablet by mouth daily.  Marland Kitchen lovastatin (MEVACOR) 20 MG tablet Take 1 tablet by mouth at bedtime.  . metFORMIN (GLUCOPHAGE) 1000 MG tablet Take 1 tablet by mouth 2 (two) times daily.  . metoprolol tartrate (LOPRESSOR) 25 MG tablet Take 1 tablet by mouth 2 (two) times daily.  Marland Kitchen omeprazole (PRILOSEC) 20 MG capsule Take 1 capsule by mouth daily.     Allergies:   Patient has no known allergies.   Social History   Socioeconomic History  . Marital status: Married    Spouse name: Not on file  . Number of children: Not on file  . Years of education: Not on file  . Highest education level: Not on file  Occupational History  . Not on file  Social Needs  . Financial resource strain: Not on file  . Food insecurity    Worry: Not on file    Inability: Not on file  . Transportation needs    Medical: Not on file    Non-medical: Not on file  Tobacco Use  . Smoking status: Never Smoker  . Smokeless tobacco: Never Used  Substance and Sexual Activity  . Alcohol use: No  . Drug use: No  .  Sexual activity: Not on file  Lifestyle  . Physical activity    Days per week: Not on file    Minutes per session: Not on file  . Stress: Not on file  Relationships  . Social Herbalist on phone: Not on file    Gets together: Not on file    Attends religious service: Not on file    Active member of club or organization: Not on file    Attends meetings of clubs or organizations: Not on file    Relationship status: Not on file  Other Topics Concern  . Not on file  Social History Narrative  . Not on file     Family History: The patient's family history includes COPD in her mother; Colon cancer in her mother; Heart attack in her father; Heart disease in her mother; Renal Disease in her mother. ROS:   Please see the history of present  illness.    All 14 point review of systems negative except as described per history of present illness.  EKGs/Labs/Other Studies Reviewed:    The following studies were reviewed today: Normal sinus rhythm, normal P interval, normal QS complex duration morphology no ST segment changes    Recent Labs: No results found for requested labs within last 8760 hours.  Recent Lipid Panel No results found for: CHOL, TRIG, HDL, CHOLHDL, VLDL, LDLCALC, LDLDIRECT  Physical Exam:    VS:  BP 110/60   Pulse 74   Ht 5\' 1"  (1.549 m)   Wt 157 lb (71.2 kg)   SpO2 98%   BMI 29.66 kg/m     Wt Readings from Last 3 Encounters:  09/25/18 157 lb (71.2 kg)     GEN:  Well nourished, well developed in no acute distress HEENT: Normal NECK: No JVD; No carotid bruits LYMPHATICS: No lymphadenopathy CARDIAC: RRR, soft holosystolic murmur grade 1/6 best heard at the left border of the sternum., no rubs, no gallops RESPIRATORY:  Clear to auscultation without rales, wheezing or rhonchi  ABDOMEN: Soft, non-tender, non-distended MUSCULOSKELETAL:  No edema; No deformity  SKIN: Warm and dry NEUROLOGIC:  Alert and oriented x 3 PSYCHIATRIC:  Normal affect   ASSESSMENT:    1. Dyspnea on exertion   2. Essential hypertension   3. Atypical chest pain   4. Palpitations   5. Type 2 diabetes mellitus without complication, without long-term current use of insulin (HCC)    PLAN:    In order of problems listed above:  1. Dyspnea on exertion obviously very concerning especially with the fact that she does have some proximal nocturnal dyspnea.  Is a gradual progression of this problem.  She need echocardiogram to assess her left ventricular ejection fraction today I will do Chem-7 as well as proBNP.  Stress test will be done to rule out potential reason for her symptoms as well.  She may have angina equivalent especially in view of the fact that she does have diabetes which is poorly controlled.  I will not alter any  of her medication until we have more clarification about her diagnosis. 2. Essential hypertension blood pressure well controlled continue present management. 3. Atypical chest pain stress test will be done 4. Palpitations she described pounding in the chest when she walks I do think this is an arrhythmia this is simply sinus tachycardia 5. Type 2 diabetes followed by internal medicine team.   Medication Adjustments/Labs and Tests Ordered: Current medicines are reviewed at length with the patient today.  Concerns regarding medicines are outlined above.  No orders of the defined types were placed in this encounter.  No orders of the defined types were placed in this encounter.   Signed, Park Liter, MD, [facility]. 09/25/2018 2:19 PM    [place]

## 2018-09-26 LAB — BASIC METABOLIC PANEL
BUN/Creatinine Ratio: 20 (ref 12–28)
BUN: 27 mg/dL (ref 8–27)
CO2: 19 mmol/L — ABNORMAL LOW (ref 20–29)
Calcium: 10 mg/dL (ref 8.7–10.3)
Chloride: 104 mmol/L (ref 96–106)
Creatinine, Ser: 1.34 mg/dL — ABNORMAL HIGH (ref 0.57–1.00)
GFR calc Af Amer: 48 mL/min/{1.73_m2} — ABNORMAL LOW (ref >59)
GFR calc non Af Amer: 41 mL/min/{1.73_m2} — ABNORMAL LOW (ref >59)
Glucose: 195 mg/dL — ABNORMAL HIGH (ref 65–99)
Potassium: 4.9 mmol/L (ref 3.5–5.2)
Sodium: 139 mmol/L (ref 134–144)

## 2018-09-26 LAB — PRO B NATRIURETIC PEPTIDE: NT-Pro BNP: 47 pg/mL (ref 0–301)

## 2018-09-26 LAB — TSH: TSH: 0.883 u[IU]/mL (ref 0.450–4.500)

## 2018-10-02 ENCOUNTER — Ambulatory Visit (INDEPENDENT_AMBULATORY_CARE_PROVIDER_SITE_OTHER): Payer: Medicare Other

## 2018-10-02 ENCOUNTER — Other Ambulatory Visit: Payer: Self-pay

## 2018-10-02 DIAGNOSIS — R0609 Other forms of dyspnea: Secondary | ICD-10-CM | POA: Diagnosis not present

## 2018-10-02 DIAGNOSIS — R0789 Other chest pain: Secondary | ICD-10-CM

## 2018-10-02 NOTE — Progress Notes (Signed)
Complete echocardiogram has been performed.  Jimmy Shatima Zalar RDCS, RVT 

## 2018-10-21 ENCOUNTER — Telehealth (HOSPITAL_COMMUNITY): Payer: Self-pay | Admitting: *Deleted

## 2018-10-21 NOTE — Telephone Encounter (Signed)
Patient's husband,per dpr,given detailed instructions per Myocardial Perfusion Study Information Sheet for the test on 10/28/18. Patient notified to arrive 15 minutes early and that it is imperative to arrive on time for appointment to keep from having the test rescheduled.  If you need to cancel or reschedule your appointment, please call the office within 24 hours of your appointment. . Patient verbalized understanding. Kirstie Peri

## 2018-10-28 ENCOUNTER — Ambulatory Visit (INDEPENDENT_AMBULATORY_CARE_PROVIDER_SITE_OTHER): Payer: Medicare Other

## 2018-10-28 ENCOUNTER — Other Ambulatory Visit: Payer: Self-pay

## 2018-10-28 DIAGNOSIS — R0600 Dyspnea, unspecified: Secondary | ICD-10-CM

## 2018-10-28 DIAGNOSIS — R0789 Other chest pain: Secondary | ICD-10-CM | POA: Diagnosis not present

## 2018-10-28 LAB — MYOCARDIAL PERFUSION IMAGING
LV dias vol: 48 mL (ref 46–106)
LV sys vol: 8 mL
Peak HR: 108 {beats}/min
Rest HR: 66 {beats}/min
SDS: 2
SRS: 0
SSS: 2
TID: 1.48

## 2018-10-28 MED ORDER — REGADENOSON 0.4 MG/5ML IV SOLN
0.4000 mg | Freq: Once | INTRAVENOUS | Status: AC
  Administered 2018-10-28: 0.4 mg via INTRAVENOUS

## 2018-10-28 MED ORDER — TECHNETIUM TC 99M TETROFOSMIN IV KIT
32.8000 | PACK | Freq: Once | INTRAVENOUS | Status: AC | PRN
  Administered 2018-10-28: 32.8 via INTRAVENOUS

## 2018-10-28 MED ORDER — TECHNETIUM TC 99M TETROFOSMIN IV KIT
10.8000 | PACK | Freq: Once | INTRAVENOUS | Status: AC | PRN
  Administered 2018-10-28: 10.8 via INTRAVENOUS

## 2018-11-04 ENCOUNTER — Encounter: Payer: Self-pay | Admitting: Cardiology

## 2018-11-04 ENCOUNTER — Ambulatory Visit (INDEPENDENT_AMBULATORY_CARE_PROVIDER_SITE_OTHER): Payer: Medicare Other | Admitting: Cardiology

## 2018-11-04 ENCOUNTER — Ambulatory Visit (HOSPITAL_BASED_OUTPATIENT_CLINIC_OR_DEPARTMENT_OTHER)
Admission: RE | Admit: 2018-11-04 | Discharge: 2018-11-04 | Disposition: A | Discharge status: Home / Self Care | Payer: Medicare Other | Source: Ambulatory Visit | Attending: Cardiology | Admitting: Cardiology

## 2018-11-04 ENCOUNTER — Other Ambulatory Visit: Payer: Self-pay

## 2018-11-04 VITALS — BP 122/72 | HR 70 | Ht 61.0 in | Wt 159.4 lb

## 2018-11-04 DIAGNOSIS — R0789 Other chest pain: Secondary | ICD-10-CM | POA: Insufficient documentation

## 2018-11-04 DIAGNOSIS — R002 Palpitations: Secondary | ICD-10-CM | POA: Insufficient documentation

## 2018-11-04 DIAGNOSIS — R9439 Abnormal result of other cardiovascular function study: Secondary | ICD-10-CM | POA: Insufficient documentation

## 2018-11-04 DIAGNOSIS — R0602 Shortness of breath: Secondary | ICD-10-CM | POA: Diagnosis not present

## 2018-11-04 DIAGNOSIS — R0600 Dyspnea, unspecified: Secondary | ICD-10-CM | POA: Insufficient documentation

## 2018-11-04 DIAGNOSIS — E119 Type 2 diabetes mellitus without complications: Secondary | ICD-10-CM | POA: Diagnosis not present

## 2018-11-04 DIAGNOSIS — R079 Chest pain, unspecified: Secondary | ICD-10-CM | POA: Diagnosis not present

## 2018-11-04 DIAGNOSIS — I10 Essential (primary) hypertension: Secondary | ICD-10-CM

## 2018-11-04 DIAGNOSIS — R0609 Other forms of dyspnea: Secondary | ICD-10-CM

## 2018-11-04 HISTORY — DX: Abnormal result of other cardiovascular function study: R94.39

## 2018-11-04 MED ORDER — LISINOPRIL 10 MG PO TABS: 10.0000 mg | ORAL_TABLET | Freq: Every day | ORAL | 1 refills | Status: DC

## 2018-11-04 NOTE — H&P (View-Only) (Signed)
Cardiology Office Note:    Date:  11/04/2018   ID:  Stefanie Erickson, DOB [birth year], MRN [id number]  PCP:  Cyndi Bender, PA-C  Cardiologist:  Jenne Campus, MD    Referring MD: Cyndi Bender, PA-C   Chief Complaint  Patient presents with  . Follow-up  Still having shortness of breath  History of Present Illness:    Stefanie Erickson is a 68 y.o. female presented to [place] with chief complaint of gradual shortness of breath which is quite profound.  She used to be very active used to be able to do a lot but now because of shortness of breath she cannot she does have multiple risk factors for coronary artery disease probably the worst 1 is diabetes which is poorly controlled.  Also hypertension dyslipidemia. She did have a stress test done stress to show ischemia involving apex and she is here to talk about that.  Denies having any typical chest pain tightness squeezing pressure burning chest.  Concern however is the fact that she may be having anginal equivalent.  We talked in length about what to do with the situation.  I offered HER-2 options first option to proceed with medications and see if she feels any better and option #2 is to go straight to cardiac catheterization.  Because of her multiple risk factors as well as symptoms that are worrisome and abnormal stress test she elected to proceed with cardiac catheterization.  Cardiac catheterization has been explained to her including all risk benefits as well as alternatives.  We will proceed.  She does have some baseline kidney dysfunction therefore I will ask her to discontinue hydrochlorothiazide and lower the dose of lisinopril.  We need to pay special attention to hydration when she will be in the cardiac cath laboratory.  Past Medical History:  Diagnosis Date  . Diabetes mellitus without complication ([place])   . Gall stones   . Hyperlipidemia   . Hypertension     Past Surgical History:  Procedure Laterality Date  .  CHOLECYSTECTOMY    . TUBAL LIGATION      Current Medications: Current Meds  Medication Sig  . aspirin 81 MG chewable tablet Chew 81 mg by mouth daily.  Marland Kitchen gabapentin (NEURONTIN) 100 MG capsule Take 1 capsule by mouth at bedtime.  Marland Kitchen glipiZIDE (GLUCOTROL XL) 5 MG 24 hr tablet Take 1 tablet by mouth daily.  Marland Kitchen lisinopril-hydrochlorothiazide (ZESTORETIC) 20-25 MG tablet Take 1 tablet by mouth daily.  Marland Kitchen lovastatin (MEVACOR) 20 MG tablet Take 1 tablet by mouth at bedtime.  . metFORMIN (GLUCOPHAGE) 1000 MG tablet Take 1 tablet by mouth 2 (two) times daily.  . metoprolol tartrate (LOPRESSOR) 25 MG tablet Take 1 tablet by mouth 2 (two) times daily.  Marland Kitchen omeprazole (PRILOSEC) 20 MG capsule Take 1 capsule by mouth daily.     Allergies:   Patient has no known allergies.   Social History   Socioeconomic History  . Marital status: Married    Spouse name: Not on file  . Number of children: Not on file  . Years of education: Not on file  . Highest education level: Not on file  Occupational History  . Not on file  Social Needs  . Financial resource strain: Not on file  . Food insecurity    Worry: Not on file    Inability: Not on file  . Transportation needs    Medical: Not on file    Non-medical: Not on file  Tobacco Use  .  Smoking status: Never Smoker  . Smokeless tobacco: Never Used  Substance and Sexual Activity  . Alcohol use: No  . Drug use: No  . Sexual activity: Not on file  Lifestyle  . Physical activity    Days per week: Not on file    Minutes per session: Not on file  . Stress: Not on file  Relationships  . Social Herbalist on phone: Not on file    Gets together: Not on file    Attends religious service: Not on file    Active member of club or organization: Not on file    Attends meetings of clubs or organizations: Not on file    Relationship status: Not on file  Other Topics Concern  . Not on file  Social History Narrative  . Not on file     Family  History: The patient's family history includes COPD in her mother; Colon cancer in her mother; Heart attack in her father; Heart disease in her mother; Renal Disease in her mother. ROS:   Please see the history of present illness.    All 14 point review of systems negative except as described per history of present illness  EKGs/Labs/Other Studies Reviewed:    Stress test done on 28 October 2018 showed.   Nuclear stress EF: 83%.  The left ventricular ejection fraction is hyperdynamic (>65%).  There was no ST segment deviation noted during stress.  Defect 1: There is a small defect of mild severity present in the apex location.  Findings consistent with ischemia.  This is a low risk study.   Echocardiogram done on October 02, 2018 showed:   1. The left ventricle has normal systolic function with an ejection fraction of 60-65%. The cavity size was normal. Left ventricular diastolic Doppler parameters are consistent with impaired relaxation.  2. The right ventricle has normal systolic function. The cavity was normal. There is no increase in right ventricular wall thickness.  3. The aorta is normal unless otherwise noted.   Recent Labs: 09/25/2018: BUN 27; Creatinine, Ser 1.34; NT-Pro BNP 47; Potassium 4.9; Sodium 139; TSH 0.883  Recent Lipid Panel No results found for: CHOL, TRIG, HDL, CHOLHDL, VLDL, LDLCALC, LDLDIRECT  Physical Exam:    VS:  BP 122/72   Pulse 70   Ht 5' 1"  (1.549 m)   Wt 159 lb 6.4 oz (72.3 kg)   SpO2 96%   BMI 30.12 kg/m     Wt Readings from Last 3 Encounters:  11/04/18 159 lb 6.4 oz (72.3 kg)  10/28/18 157 lb (71.2 kg)  09/25/18 157 lb (71.2 kg)     GEN:  Well nourished, well developed in no acute distress HEENT: Normal NECK: No JVD; No carotid bruits LYMPHATICS: No lymphadenopathy CARDIAC: RRR, no murmurs, no rubs, no gallops RESPIRATORY:  Clear to auscultation without rales, wheezing or rhonchi  ABDOMEN: Soft, non-tender, non-distended  MUSCULOSKELETAL:  No edema; No deformity  SKIN: Warm and dry LOWER EXTREMITIES: no swelling NEUROLOGIC:  Alert and oriented x 3 PSYCHIATRIC:  Normal affect   ASSESSMENT:    1. Dyspnea on exertion   2. Abnormal stress test ischemia involving the apex   3. Atypical chest pain   4. Palpitations   5. Essential hypertension   6. Type 2 diabetes mellitus without complication, without long-term current use of insulin (HCC)    PLAN:    In order of problems listed above:  1. Dyspnea on exertion abnormal stress test plan is to  proceed with cardiac catheterization she is already on aspirin statin and beta-blocker which I will continue 2. Abnormal stress test proceed with cardiac catheterization 3. Palpitations denies having any 4. Essential hypertension blood pressure controlled 5. Type 2 diabetes better control but still some room for improvement. 6. Dyslipidemia on statin which I will continue.   Medication Adjustments/Labs and Tests Ordered: Current medicines are reviewed at length with the patient today.  Concerns regarding medicines are outlined above.  No orders of the defined types were placed in this encounter.  Medication changes: No orders of the defined types were placed in this encounter.   Signed, Park Liter, MD, [facility] 11/04/2018 3:38 PM    [place]

## 2018-11-04 NOTE — Addendum Note (Signed)
Addended by: Ashok Norris on: 11/04/2018 04:04 PM   Modules accepted: Orders

## 2018-11-04 NOTE — Progress Notes (Signed)
Cardiology Office Note:    Date:  11/04/2018   ID:  Stefanie Erickson, DOB 1950/07/06, MRN [id number]  PCP:  Stefanie Bender, PA-C  Cardiologist:  Stefanie Campus, MD    Referring MD: Stefanie Bender, PA-C   Chief Complaint  Patient presents with  . Follow-up  Still having shortness of breath  History of Present Illness:    Stefanie Erickson is a 68 y.o. female presented to [place] with chief complaint of gradual shortness of breath which is quite profound.  She used to be very active used to be able to do a lot but now because of shortness of breath she cannot she does have multiple risk factors for coronary artery disease probably the worst 1 is diabetes which is poorly controlled.  Also hypertension dyslipidemia. She did have a stress test done stress to show ischemia involving apex and she is here to talk about that.  Denies having any typical chest pain tightness squeezing pressure burning chest.  Concern however is the fact that she may be having anginal equivalent.  We talked in length about what to do with the situation.  I offered HER-2 options first option to proceed with medications and see if she feels any better and option #2 is to go straight to cardiac catheterization.  Because of her multiple risk factors as well as symptoms that are worrisome and abnormal stress test she elected to proceed with cardiac catheterization.  Cardiac catheterization has been explained to her including all risk benefits as well as alternatives.  We will proceed.  She does have some baseline kidney dysfunction therefore I will ask her to discontinue hydrochlorothiazide and lower the dose of lisinopril.  We need to pay special attention to hydration when she will be in the cardiac cath laboratory.  Past Medical History:  Diagnosis Date  . Diabetes mellitus without complication ([place])   . Gall stones   . Hyperlipidemia   . Hypertension     Past Surgical History:  Procedure Laterality Date  .  CHOLECYSTECTOMY    . TUBAL LIGATION      Current Medications: Current Meds  Medication Sig  . aspirin 81 MG chewable tablet Chew 81 mg by mouth daily.  Marland Kitchen gabapentin (NEURONTIN) 100 MG capsule Take 1 capsule by mouth at bedtime.  Marland Kitchen glipiZIDE (GLUCOTROL XL) 5 MG 24 hr tablet Take 1 tablet by mouth daily.  Marland Kitchen lisinopril-hydrochlorothiazide (ZESTORETIC) 20-25 MG tablet Take 1 tablet by mouth daily.  Marland Kitchen lovastatin (MEVACOR) 20 MG tablet Take 1 tablet by mouth at bedtime.  . metFORMIN (GLUCOPHAGE) 1000 MG tablet Take 1 tablet by mouth 2 (two) times daily.  . metoprolol tartrate (LOPRESSOR) 25 MG tablet Take 1 tablet by mouth 2 (two) times daily.  Marland Kitchen omeprazole (PRILOSEC) 20 MG capsule Take 1 capsule by mouth daily.     Allergies:   Patient has no known allergies.   Social History   Socioeconomic History  . Marital status: Married    Spouse name: Not on file  . Number of children: Not on file  . Years of education: Not on file  . Highest education level: Not on file  Occupational History  . Not on file  Social Needs  . Financial resource strain: Not on file  . Food insecurity    Worry: Not on file    Inability: Not on file  . Transportation needs    Medical: Not on file    Non-medical: Not on file  Tobacco Use  .  Smoking status: Never Smoker  . Smokeless tobacco: Never Used  Substance and Sexual Activity  . Alcohol use: No  . Drug use: No  . Sexual activity: Not on file  Lifestyle  . Physical activity    Days per week: Not on file    Minutes per session: Not on file  . Stress: Not on file  Relationships  . Social Herbalist on phone: Not on file    Gets together: Not on file    Attends religious service: Not on file    Active member of club or organization: Not on file    Attends meetings of clubs or organizations: Not on file    Relationship status: Not on file  Other Topics Concern  . Not on file  Social History Narrative  . Not on file     Family  History: The patient's family history includes COPD in her mother; Colon cancer in her mother; Heart attack in her father; Heart disease in her mother; Renal Disease in her mother. ROS:   Please see the history of present illness.    All 14 point review of systems negative except as described per history of present illness  EKGs/Labs/Other Studies Reviewed:    Stress test done on 28 October 2018 showed.   Nuclear stress EF: 83%.  The left ventricular ejection fraction is hyperdynamic (>65%).  There was no ST segment deviation noted during stress.  Defect 1: There is a small defect of mild severity present in the apex location.  Findings consistent with ischemia.  This is a low risk study.   Echocardiogram done on October 02, 2018 showed:   1. The left ventricle has normal systolic function with an ejection fraction of 60-65%. The cavity size was normal. Left ventricular diastolic Doppler parameters are consistent with impaired relaxation.  2. The right ventricle has normal systolic function. The cavity was normal. There is no increase in right ventricular wall thickness.  3. The aorta is normal unless otherwise noted.   Recent Labs: 09/25/2018: BUN 27; Creatinine, Ser 1.34; NT-Pro BNP 47; Potassium 4.9; Sodium 139; TSH 0.883  Recent Lipid Panel No results found for: CHOL, TRIG, HDL, CHOLHDL, VLDL, LDLCALC, LDLDIRECT  Physical Exam:    VS:  BP 122/72   Pulse 70   Ht 5' 1"  (1.549 m)   Wt 159 lb 6.4 oz (72.3 kg)   SpO2 96%   BMI 30.12 kg/m     Wt Readings from Last 3 Encounters:  11/04/18 159 lb 6.4 oz (72.3 kg)  10/28/18 157 lb (71.2 kg)  09/25/18 157 lb (71.2 kg)     GEN:  Well nourished, well developed in no acute distress HEENT: Normal NECK: No JVD; No carotid bruits LYMPHATICS: No lymphadenopathy CARDIAC: RRR, no murmurs, no rubs, no gallops RESPIRATORY:  Clear to auscultation without rales, wheezing or rhonchi  ABDOMEN: Soft, non-tender, non-distended  MUSCULOSKELETAL:  No edema; No deformity  SKIN: Warm and dry LOWER EXTREMITIES: no swelling NEUROLOGIC:  Alert and oriented x 3 PSYCHIATRIC:  Normal affect   ASSESSMENT:    1. Dyspnea on exertion   2. Abnormal stress test ischemia involving the apex   3. Atypical chest pain   4. Palpitations   5. Essential hypertension   6. Type 2 diabetes mellitus without complication, without long-term current use of insulin (HCC)    PLAN:    In order of problems listed above:  1. Dyspnea on exertion abnormal stress test plan is to  proceed with cardiac catheterization she is already on aspirin statin and beta-blocker which I will continue 2. Abnormal stress test proceed with cardiac catheterization 3. Palpitations denies having any 4. Essential hypertension blood pressure controlled 5. Type 2 diabetes better control but still some room for improvement. 6. Dyslipidemia on statin which I will continue.   Medication Adjustments/Labs and Tests Ordered: Current medicines are reviewed at length with the patient today.  Concerns regarding medicines are outlined above.  No orders of the defined types were placed in this encounter.  Medication changes: No orders of the defined types were placed in this encounter.   Signed, Park Liter, MD, [facility] 11/04/2018 3:38 PM    [place]

## 2018-11-04 NOTE — Patient Instructions (Signed)
Medication Instructions:  Your physician has recommended you make the following change in your medication:   Stop: Lisinopril-hydrochlorothiazide   Start: Lisinopril 10 mg daily   If you need a refill on your cardiac medications before your next appointment, please call your pharmacy.   Lab work: Your physician recommends that you return for lab work today: bmp, cbc   If you have labs (blood work) drawn today and your tests are completely normal, you will receive your results only by: Marland Kitchen MyChart Message (if you have MyChart) OR . A paper copy in the mail If you have any lab test that is abnormal or we need to change your treatment, we will call you to review the results.  Testing/Procedures: A chest x-ray takes a picture of the organs and structures inside the chest, including the heart, lungs, and blood vessels. This test can show several things, including, whether the heart is enlarges; whether fluid is building up in the lungs; and whether pacemaker / defibrillator leads are still in place.     [place], [address] [place] [address] Dept: [phone] Loc: [phone]  JAMIAYA GARRIGAN  11/04/2018  You are scheduled for a Cardiac Catheterization on Monday, October 19 with Dr. Harrell Gave End.  1. Please arrive at the [facility] (Main Entrance A) at [facility]: [place],  [id number] at 5:30 AM (This time is two hours before your procedure to ensure your preparation). Free valet parking service is available.   Special note: Every effort is made to have your procedure done on time. Please understand that emergencies sometimes delay scheduled procedures.  2. Diet: Do not eat solid foods after midnight.  The patient may have clear liquids until 5am upon the day of the procedure.  3. Labs: You will need to have blood drawn today: BMP, CBC   4.  Medication instructions in preparation for your procedure:   HOLD GLIPIZIDE and METFORMIN the day of the test.   Do not take Diabetes Med Glucophage (Metformin) on the day of the procedure and HOLD 48 HOURS AFTER THE PROCEDURE.  On the morning of your procedure, take your Aspirin and any morning medicines NOT listed above.  You may use sips of water.  5. Plan for one night stay--bring personal belongings. 6. Bring a current list of your medications and current insurance cards. 7. You MUST have a responsible person to drive you home. 8. Someone MUST be with you the first 24 hours after you arrive home or your discharge will be delayed. 9. Please wear clothes that are easy to get on and off and wear slip-on shoes.  Thank you for allowing [place] to care for you!   -- [facility]   Follow-Up: At [facility], you and your health needs are our priority.  As part of our continuing mission to provide you with exceptional heart care, we have created designated Provider Care Teams.  These Care Teams include your primary Cardiologist (physician) and Advanced Practice Providers (APPs -  Physician Assistants and Nurse Practitioners) who all work together to provide you with the care you need, when you need it. You will need a follow up appointment in 1 months.  Please call our office 2 months in advance to schedule this appointment.  You may see No primary care provider on file. or another member of our [employer] in  [place]: Shirlee More, MD . Jyl Heinz, MD  Any Other Special Instructions Will Be Listed Below (If Applicable).   Coronary Angiogram With Stent Coronary angiogram with stent placement is a procedure to widen or open a narrow blood vessel of the heart (coronary artery). Arteries may become blocked by cholesterol buildup (plaques) in the lining of the wall. When a coronary artery becomes partially blocked, blood flow to that area  decreases. This may lead to chest pain or a heart attack (myocardial infarction). A stent is a small piece of metal that looks like mesh or a spring. Stent placement may be done as treatment for a heart attack or right after a coronary angiogram in which a blocked artery is found. Let your health care provider know about:  Any allergies you have.  All medicines you are taking, including vitamins, herbs, eye drops, creams, and over-the-counter medicines.  Any problems you or family members have had with anesthetic medicines.  Any blood disorders you have.  Any surgeries you have had.  Any medical conditions you have.  Whether you are pregnant or may be pregnant. What are the risks? Generally, this is a safe procedure. However, problems may occur, including:  Damage to the heart or its blood vessels.  A return of blockage.  Bleeding, infection, or bruising at the insertion site.  A collection of blood under the skin (hematoma) at the insertion site.  A blood clot in another part of the body.  Kidney injury.  Allergic reaction to the dye or contrast that is used.  Bleeding into the abdomen (retroperitoneal bleeding). What happens before the procedure? Staying hydrated Follow instructions from your health care provider about hydration, which may include:  Up to 2 hours before the procedure - you may continue to drink clear liquids, such as water, clear fruit juice, black coffee, and plain tea.  Eating and drinking restrictions Follow instructions from your health care provider about eating and drinking, which may include:  8 hours before the procedure - stop eating heavy meals or foods such as meat, fried foods, or fatty foods.  6 hours before the procedure - stop eating light meals or foods, such as toast or cereal.  2 hours before the procedure - stop drinking clear liquids. Ask your health care provider about:  Changing or stopping your regular medicines. This is  especially important if you are taking diabetes medicines or blood thinners.  Taking medicines such as ibuprofen. These medicines can thin your blood. Do not take these medicines before your procedure if your health care provider instructs you not to. Generally, aspirin is recommended before a procedure of passing a small, thin tube (catheter) through a blood vessel and into the heart (cardiac catheterization). What happens during the procedure?   An IV tube will be inserted into one of your veins.  You will be given one or more of the following: ? A medicine to help you relax (sedative). ? A medicine to numb the area where the catheter will be inserted into an artery (local anesthetic).  To reduce your risk of infection: ? Your health care team will wash or sanitize their hands. ? Your skin will be washed with soap. ? Hair may be removed from the area where the catheter will be inserted.  Using a guide wire, the catheter will be inserted into an artery. The location may be in your groin, in your wrist, or in the fold of your arm (near your elbow).  A type  of X-ray (fluoroscopy) will be used to help guide the catheter to the opening of the arteries in the heart.  A dye will be injected into the catheter, and X-rays will be taken. The dye will help to show where any narrowing or blockages are located in the arteries.  A tiny wire will be guided to the blocked spot, and a balloon will be inflated to make the artery wider.  The stent will be expanded and will crush the plaques into the wall of the vessel. The stent will hold the area open and improve the blood flow. Most stents have a drug coating to reduce the risk of the stent narrowing over time.  The artery may be made wider using a drill, laser, or other tools to remove plaques.  When the blood flow is better, the catheter will be removed. The lining of the artery will grow over the stent, which stays where it was placed. This  procedure may vary among health care providers and hospitals. What happens after the procedure?  If the procedure is done through the leg, you will be kept in bed lying flat for about 6 hours. You will be instructed to not bend and not cross your legs.  The insertion site will be checked frequently.  The pulse in your foot or wrist will be checked frequently.  You may have additional blood tests, X-rays, and a test that records the electrical activity of your heart (electrocardiogram, or ECG). This information is not intended to replace advice given to you by your health care provider. Make sure you discuss any questions you have with your health care provider. Document Released: 07/14/2002 Document Revised: 04/18/2017 Document Reviewed: 08/13/2015 Elsevier Patient Education  [place].   Lisinopril tablets What is this medicine? LISINOPRIL (lyse IN oh pril) is an ACE inhibitor. This medicine is used to treat high blood pressure and heart failure. It is also used to protect the heart immediately after a heart attack. This medicine may be used for other purposes; ask your health care provider or pharmacist if you have questions. COMMON BRAND NAME(S): Prinivil, Zestril What should I tell my health care provider before I take this medicine? They need to know if you have any of these conditions:  diabetes  heart or blood vessel disease  kidney disease  low blood pressure  previous swelling of the tongue, face, or lips with difficulty breathing, difficulty swallowing, hoarseness, or tightening of the throat  an unusual or allergic reaction to lisinopril, other ACE inhibitors, insect venom, foods, dyes, or preservatives  pregnant or trying to get pregnant  breast-feeding How should I use this medicine? Take this medicine by mouth with a glass of water. Follow the directions on your prescription label. You may take this medicine with or without food. If it upsets your  stomach, take it with food. Take your medicine at regular intervals. Do not take it more often than directed. Do not stop taking except on your doctor's advice. Talk to your pediatrician regarding the use of this medicine in children. Special care may be needed. While this drug may be prescribed for children as young as 49 years of age for selected conditions, precautions do apply. Overdosage: If you think you have taken too much of this medicine contact a poison control center or emergency room at once. NOTE: This medicine is only for you. Do not share this medicine with others. What if I miss a dose? If you miss a dose, take  it as soon as you can. If it is almost time for your next dose, take only that dose. Do not take double or extra doses. What may interact with this medicine? Do not take this medicine with any of the following medications:  hymenoptera venom  sacubitril; valsartan This medicines may also interact with the following medications:  aliskiren  angiotensin receptor blockers, like losartan or valsartan  certain medicines for diabetes  diuretics  everolimus  gold compounds  lithium  NSAIDs, medicines for pain and inflammation, like ibuprofen or naproxen  potassium salts or supplements  salt substitutes  sirolimus  temsirolimus This list may not describe all possible interactions. Give your health care provider a list of all the medicines, herbs, non-prescription drugs, or dietary supplements you use. Also tell them if you smoke, drink alcohol, or use illegal drugs. Some items may interact with your medicine. What should I watch for while using this medicine? Visit your doctor or health care professional for regular check ups. Check your blood pressure as directed. Ask your doctor what your blood pressure should be, and when you should contact him or her. Do not treat yourself for coughs, colds, or pain while you are using this medicine without asking your doctor  or health care professional for advice. Some ingredients may increase your blood pressure. Women should inform their doctor if they wish to become pregnant or think they might be pregnant. There is a potential for serious side effects to an unborn child. Talk to your health care professional or pharmacist for more information. Check with your doctor or health care professional if you get an attack of severe diarrhea, nausea and vomiting, or if you sweat a lot. The loss of too much body fluid can make it dangerous for you to take this medicine. You may get drowsy or dizzy. Do not drive, use machinery, or do anything that needs mental alertness until you know how this drug affects you. Do not stand or sit up quickly, especially if you are an older patient. This reduces the risk of dizzy or fainting spells. Alcohol can make you more drowsy and dizzy. Avoid alcoholic drinks. Avoid salt substitutes unless you are told otherwise by your doctor or health care professional. What side effects may I notice from receiving this medicine? Side effects that you should report to your doctor or health care professional as soon as possible:  allergic reactions like skin rash, itching or hives, swelling of the hands, feet, face, lips, throat, or tongue  breathing problems  signs and symptoms of kidney injury like trouble passing urine or change in the amount of urine  signs and symptoms of increased potassium like muscle weakness; chest pain; or fast, irregular heartbeat  signs and symptoms of liver injury like dark yellow or brown urine; general ill feeling or flu-like symptoms; light-colored stools; loss of appetite; nausea; right upper belly pain; unusually weak or tired; yellowing of the eyes or skin  signs and symptoms of low blood pressure like dizziness; feeling faint or lightheaded, falls; unusually weak or tired  stomach pain with or without nausea and vomiting Side effects that usually do not require  medical attention (report to your doctor or health care professional if they continue or are bothersome):  changes in taste  cough  dizziness  fever  headache  sensitivity to light This list may not describe all possible side effects. Call your doctor for medical advice about side effects. You may report side effects to FDA at  1-800-FDA-1088. Where should I keep my medicine? Keep out of the reach of children. Store at room temperature between 15 and 30 degrees C (59 and 86 degrees F). Protect from moisture. Keep container tightly closed. Throw away any unused medicine after the expiration date. NOTE: This sheet is a summary. It may not cover all possible information. If you have questions about this medicine, talk to your doctor, pharmacist, or health care provider.  2020 Elsevier/Gold Standard (2015-02-27 12:52:35)   Chest X-Ray A chest X-ray is a painless test that uses radiation to create images of the structures inside of your chest. Chest X-rays are used to look for many health conditions, including heart failure, pneumonia, tuberculosis, rib fractures, breathing disorders, and cancer. They may be used to diagnose chest pain, constant coughing, or trouble breathing. Tell a health care provider about:  Any allergies you have.  All medicines you are taking, including vitamins, herbs, eye drops, creams, and over-the-counter medicines.  Any surgeries you have had.  Any medical conditions you have.  Whether you are pregnant or may be pregnant. What are the risks? Getting a chest X-ray is a safe procedure. However, you will be exposed to a small amount of radiation. Being exposed to too much radiation over a lifetime can increase the risk of cancer. This risk is small, but it may occur if you have many X-rays throughout your life. What happens before the procedure?  You may be asked to remove glasses, jewelry, and any other metal objects.  You will be asked to undress from  the waist up. You may be given a hospital gown to wear.  You may be asked to wear a protective lead apron to protect parts of your body from radiation. What happens during the procedure?   You will be asked to stand still as each picture is taken to get the best possible images.  You will be asked to take a deep breath and hold your breath for a few seconds.  The X-ray machine will create a picture of your chest using a tiny burst of radiation. This is painless.  More pictures may be taken from other angles. Typically, one picture will be taken while you face the X-ray camera, and another picture will be taken from the side while you stand. If you cannot stand, you may be asked to lie down. The procedure may vary among health care providers and hospitals. What happens after the procedure?  The X-ray(s) will be reviewed by your health care provider or an X-ray (radiology) specialist.  It is up to you to get your test results. Ask your health care provider, or the department that is doing the test, when your results will be ready.  Your health care provider will tell you if you need more tests or a follow-up exam. Keep all follow-up visits as told by your health care provider. This is important. Summary  A chest X-ray is a safe, painless test that is used to examine the inside of the chest, heart, and lungs.  You will need to undress from the waist up and remove jewelry and metal objects before the procedure.  You will be exposed to a small amount of radiation during the procedure.  The X-ray machine will take one or more pictures of your chest while you remain as still as possible.  Later, a health care provider or specialist will review the test results with you. This information is not intended to replace advice given to you by  your health care provider. Make sure you discuss any questions you have with your health care provider. Document Released: 03/05/2016 Document Revised:  04/29/2018 Document Reviewed: 03/05/2016 Elsevier Patient Education  2020 [employer].

## 2018-11-05 ENCOUNTER — Telehealth: Payer: Self-pay | Admitting: *Deleted

## 2018-11-05 LAB — BASIC METABOLIC PANEL
BUN/Creatinine Ratio: 17 (ref 12–28)
BUN: 27 mg/dL (ref 8–27)
CO2: 21 mmol/L (ref 20–29)
Calcium: 9.5 mg/dL (ref 8.7–10.3)
Chloride: 104 mmol/L (ref 96–106)
Creatinine, Ser: 1.55 mg/dL — ABNORMAL HIGH (ref 0.57–1.00)
GFR calc Af Amer: 40 mL/min/{1.73_m2} — ABNORMAL LOW (ref >59)
GFR calc non Af Amer: 35 mL/min/{1.73_m2} — ABNORMAL LOW (ref >59)
Glucose: 128 mg/dL — ABNORMAL HIGH (ref 65–99)
Potassium: 4.7 mmol/L (ref 3.5–5.2)
Sodium: 140 mmol/L (ref 134–144)

## 2018-11-05 LAB — CBC
Hematocrit: 41.5 % (ref 34.0–46.6)
Hemoglobin: 13.6 g/dL (ref 11.1–15.9)
MCH: 28 pg (ref 26.6–33.0)
MCHC: 32.8 g/dL (ref 31.5–35.7)
MCV: 85 fL (ref 79–97)
Platelets: 266 10*3/uL (ref 150–450)
RBC: 4.86 x10E6/uL (ref 3.77–5.28)
RDW: 12.6 % (ref 11.7–15.4)
WBC: 9.9 10*3/uL (ref 3.4–10.8)

## 2018-11-05 NOTE — Telephone Encounter (Signed)
Pt contacted pre-catheterization scheduled at [facility] for: Monday November 09, 2018 12 noon Verified arrival time and place: [place] [facility]) at: 7 AM-pre procedure hydration   No solid food after midnight prior to cath, clear liquids until 5 AM day of procedure. Contrast allergy: no  Hold: Lisinopril-day before and day of procedure-GFR 40 Metformin-day of procedure and 48 hours post procedure. Glipizide -AM of procedure.  Except hold medications AM meds can be  taken pre-cath with sip of water including: ASA 81 mg  Confirmed patient has responsible adult to drive home post procedure and observe 24 hours after arriving home: yes  Currently, due to Covid-19 pandemic, only one support person will be allowed with patient. Must be the same support person for that patient's entire stay, will be screened and required to wear a mask. They will be asked to wait in the waiting room for the duration of the patient's stay.  Patients are required to wear a mask when they enter the hospital.     COVID-19 Pre-Screening Questions:  . In the past 7 to 10 days have you had a cough,  shortness of breath, headache, congestion, fever (100 or greater) body aches, chills, sore throat, or sudden loss of taste or sense of smell? no . Have you been around anyone with known Covid 19? no . Have you been around anyone who is awaiting Covid 19 test results in the past 7 to 10 days? no . Have you been around anyone who has been exposed to Covid 19, or has mentioned symptoms of Covid 19 within the past 7 to 10 days? no   I reviewed procedure/mask/visitor instructions, Covid-19 screening questions with patient, she verbalized understanding, thanked me for call.  Per Dr Johnathan Hausen BMP on arrival at hospital AM of procedure.

## 2018-11-06 ENCOUNTER — Other Ambulatory Visit (HOSPITAL_COMMUNITY)
Admission: RE | Admit: 2018-11-06 | Discharge: 2018-11-06 | Disposition: A | Discharge status: Home / Self Care | Payer: Medicare Other | Source: Ambulatory Visit | Attending: Internal Medicine | Admitting: Internal Medicine

## 2018-11-06 DIAGNOSIS — Z20828 Contact with and (suspected) exposure to other viral communicable diseases: Secondary | ICD-10-CM | POA: Insufficient documentation

## 2018-11-06 DIAGNOSIS — Z01812 Encounter for preprocedural laboratory examination: Secondary | ICD-10-CM | POA: Diagnosis not present

## 2018-11-08 LAB — NOVEL CORONAVIRUS, NAA (HOSP ORDER, SEND-OUT TO REF LAB; TAT 18-24 HRS): SARS-CoV-2, NAA: NOT DETECTED

## 2018-11-09 ENCOUNTER — Other Ambulatory Visit: Payer: Self-pay

## 2018-11-09 ENCOUNTER — Encounter (HOSPITAL_COMMUNITY)
Admission: RE | Disposition: A | Discharge status: Home / Self Care | Payer: Self-pay | Source: Home / Self Care | Attending: Internal Medicine

## 2018-11-09 ENCOUNTER — Ambulatory Visit (HOSPITAL_COMMUNITY)
Admission: RE | Admit: 2018-11-09 | Discharge: 2018-11-09 | Disposition: A | Discharge status: Home / Self Care | Payer: Medicare Other | Attending: Internal Medicine | Admitting: Internal Medicine

## 2018-11-09 DIAGNOSIS — R0789 Other chest pain: Secondary | ICD-10-CM | POA: Insufficient documentation

## 2018-11-09 DIAGNOSIS — Z8249 Family history of ischemic heart disease and other diseases of the circulatory system: Secondary | ICD-10-CM | POA: Diagnosis not present

## 2018-11-09 DIAGNOSIS — Z79899 Other long term (current) drug therapy: Secondary | ICD-10-CM | POA: Insufficient documentation

## 2018-11-09 DIAGNOSIS — I10 Essential (primary) hypertension: Secondary | ICD-10-CM | POA: Diagnosis not present

## 2018-11-09 DIAGNOSIS — Z7984 Long term (current) use of oral hypoglycemic drugs: Secondary | ICD-10-CM | POA: Diagnosis not present

## 2018-11-09 DIAGNOSIS — E785 Hyperlipidemia, unspecified: Secondary | ICD-10-CM | POA: Diagnosis not present

## 2018-11-09 DIAGNOSIS — E119 Type 2 diabetes mellitus without complications: Secondary | ICD-10-CM | POA: Diagnosis not present

## 2018-11-09 DIAGNOSIS — R9439 Abnormal result of other cardiovascular function study: Secondary | ICD-10-CM | POA: Diagnosis not present

## 2018-11-09 DIAGNOSIS — R002 Palpitations: Secondary | ICD-10-CM | POA: Insufficient documentation

## 2018-11-09 DIAGNOSIS — R0609 Other forms of dyspnea: Secondary | ICD-10-CM | POA: Diagnosis not present

## 2018-11-09 DIAGNOSIS — Z7982 Long term (current) use of aspirin: Secondary | ICD-10-CM | POA: Diagnosis not present

## 2018-11-09 DIAGNOSIS — R0600 Dyspnea, unspecified: Secondary | ICD-10-CM | POA: Diagnosis not present

## 2018-11-09 HISTORY — PX: LEFT HEART CATH AND CORONARY ANGIOGRAPHY: CATH118249

## 2018-11-09 LAB — GLUCOSE, CAPILLARY
Glucose-Capillary: 123 mg/dL — ABNORMAL HIGH (ref 70–99)
Glucose-Capillary: 115 mg/dL — ABNORMAL HIGH (ref 70–99)

## 2018-11-09 LAB — BASIC METABOLIC PANEL
Anion gap: 11 (ref 5–15)
BUN: 19 mg/dL (ref 8–23)
CO2: 21 mmol/L — ABNORMAL LOW (ref 22–32)
Calcium: 9.2 mg/dL (ref 8.9–10.3)
Chloride: 108 mmol/L (ref 98–111)
Creatinine, Ser: 1.18 mg/dL — ABNORMAL HIGH (ref 0.44–1.00)
GFR calc Af Amer: 56 mL/min — ABNORMAL LOW (ref >60)
GFR calc non Af Amer: 48 mL/min — ABNORMAL LOW (ref >60)
Glucose, Bld: 128 mg/dL — ABNORMAL HIGH (ref 70–99)
Potassium: 3.7 mmol/L (ref 3.5–5.1)
Sodium: 140 mmol/L (ref 135–145)

## 2018-11-09 SURGERY — LEFT HEART CATH AND CORONARY ANGIOGRAPHY
Anesthesia: LOCAL

## 2018-11-09 MED ORDER — HEPARIN (PORCINE) IN NACL 1000-0.9 UT/500ML-% IV SOLN
INTRAVENOUS | Status: DC | PRN
  Administered 2018-11-09 (×2): 500 mL

## 2018-11-09 MED ORDER — LIDOCAINE HCL (PF) 1 % IJ SOLN
INTRAMUSCULAR | Status: AC
  Filled 2018-11-09: qty 30

## 2018-11-09 MED ORDER — ASPIRIN 81 MG PO CHEW: 81.0000 mg | CHEWABLE_TABLET | ORAL | Status: DC

## 2018-11-09 MED ORDER — MIDAZOLAM HCL 2 MG/2ML IJ SOLN
INTRAMUSCULAR | Status: AC
  Filled 2018-11-09: qty 2

## 2018-11-09 MED ORDER — SODIUM CHLORIDE 0.9 % IV SOLN: 250.0000 mL | INTRAVENOUS | Status: DC | PRN

## 2018-11-09 MED ORDER — SODIUM CHLORIDE 0.9% FLUSH: 3.0000 mL | Freq: Two times a day (BID) | INTRAVENOUS | Status: DC

## 2018-11-09 MED ORDER — SODIUM CHLORIDE 0.9% FLUSH: 3.0000 mL | INTRAVENOUS | Status: DC | PRN

## 2018-11-09 MED ORDER — MIDAZOLAM HCL 2 MG/2ML IJ SOLN
INTRAMUSCULAR | Status: DC | PRN
  Administered 2018-11-09: 1 mg via INTRAVENOUS

## 2018-11-09 MED ORDER — SODIUM CHLORIDE 0.9 % WEIGHT BASED INFUSION
3.0000 mL/kg/h | INTRAVENOUS | Status: AC
  Administered 2018-11-09: 3 mL/kg/h via INTRAVENOUS

## 2018-11-09 MED ORDER — IOHEXOL 350 MG/ML SOLN
INTRAVENOUS | Status: DC | PRN
  Administered 2018-11-09: 40 mL via INTRA_ARTERIAL

## 2018-11-09 MED ORDER — FENTANYL CITRATE (PF) 100 MCG/2ML IJ SOLN
INTRAMUSCULAR | Status: AC
  Filled 2018-11-09: qty 2

## 2018-11-09 MED ORDER — HEPARIN SODIUM (PORCINE) 1000 UNIT/ML IJ SOLN
INTRAMUSCULAR | Status: AC
  Filled 2018-11-09: qty 1

## 2018-11-09 MED ORDER — FENTANYL CITRATE (PF) 100 MCG/2ML IJ SOLN
INTRAMUSCULAR | Status: DC | PRN
  Administered 2018-11-09: 25 ug via INTRAVENOUS

## 2018-11-09 MED ORDER — SODIUM CHLORIDE 0.9 % WEIGHT BASED INFUSION: 1.0000 mL/kg/h | INTRAVENOUS | Status: DC

## 2018-11-09 MED ORDER — VERAPAMIL HCL 2.5 MG/ML IV SOLN
INTRAVENOUS | Status: DC | PRN
  Administered 2018-11-09: 10 mL via INTRA_ARTERIAL

## 2018-11-09 MED ORDER — VERAPAMIL HCL 2.5 MG/ML IV SOLN
INTRAVENOUS | Status: AC
  Filled 2018-11-09: qty 2

## 2018-11-09 MED ORDER — HEPARIN SODIUM (PORCINE) 1000 UNIT/ML IJ SOLN
INTRAMUSCULAR | Status: DC | PRN
  Administered 2018-11-09: 3500 [IU] via INTRAVENOUS

## 2018-11-09 MED ORDER — METFORMIN HCL 1000 MG PO TABS: 1000.0000 mg | ORAL_TABLET | Freq: Two times a day (BID) | ORAL | Status: DC

## 2018-11-09 MED ORDER — HEPARIN (PORCINE) IN NACL 1000-0.9 UT/500ML-% IV SOLN
INTRAVENOUS | Status: AC
  Filled 2018-11-09: qty 1000

## 2018-11-09 MED ORDER — LIDOCAINE HCL (PF) 1 % IJ SOLN
INTRAMUSCULAR | Status: DC | PRN
  Administered 2018-11-09: 2 mL

## 2018-11-09 SURGICAL SUPPLY — 11 items
CATH 5FR JL3.5 JR4 ANG PIG MP (CATHETERS) ×1 IMPLANT
DEVICE RAD COMP TR BAND LRG (VASCULAR PRODUCTS) ×1 IMPLANT
GLIDESHEATH SLEND SS 6F .021 (SHEATH) ×1 IMPLANT
GUIDEWIRE INQWIRE 1.5J.035X260 (WIRE) IMPLANT
INQWIRE 1.5J .035X260CM (WIRE) ×2
KIT HEART LEFT (KITS) ×2 IMPLANT
PACK CARDIAC CATHETERIZATION (CUSTOM PROCEDURE TRAY) ×2 IMPLANT
SHEATH PROBE COVER 6X72 (BAG) ×1 IMPLANT
TRANSDUCER W/STOPCOCK (MISCELLANEOUS) ×2 IMPLANT
TUBING CIL FLEX 10 FLL-RA (TUBING) ×2 IMPLANT
WIRE HI TORQ VERSACORE-J 145CM (WIRE) ×1 IMPLANT

## 2018-11-09 NOTE — Interval H&P Note (Signed)
History and Physical Interval Note:  11/09/2018 11:39 AM  Stefanie Erickson  has presented today for surgery, with the diagnosis of Abnormal stress test.  The various methods of treatment have been discussed with the patient and family. After consideration of risks, benefits and other options for treatment, the patient has consented to  Procedure(s): LEFT HEART CATH AND CORONARY ANGIOGRAPHY (N/A) as a surgical intervention.  The patient's history has been reviewed, patient examined, no change in status, stable for surgery.  I have reviewed the patient's chart and labs.  Questions were answered to the patient's satisfaction.    Cath Lab Visit (complete for each Cath Lab visit)  Clinical Evaluation Leading to the Procedure:   ACS: No.  Non-ACS:    Anginal Classification: CCS III  Anti-ischemic medical therapy: Minimal Therapy (1 class of medications)  Non-Invasive Test Results: Low-risk stress test findings: cardiac mortality <1%/year  Prior CABG: No previous CABG       [name] [facility] 11/09/2018 11:39 AM

## 2018-11-09 NOTE — Discharge Instructions (Signed)
Radial Site Care ° °This sheet gives you information about how to care for yourself after your procedure. Your health care provider may also give you more specific instructions. If you have problems or questions, contact your health care provider. °What can I expect after the procedure? °After the procedure, it is common to have: °· Bruising and tenderness at the catheter insertion area. °Follow these instructions at home: °Medicines °· Take over-the-counter and prescription medicines only as told by your health care provider. °Insertion site care °· Follow instructions from your health care provider about how to take care of your insertion site. Make sure you: °? Wash your hands with soap and water before you change your bandage (dressing). If soap and water are not available, use hand sanitizer. °? Change your dressing as told by your health care provider. °? Leave stitches (sutures), skin glue, or adhesive strips in place. These skin closures may need to stay in place for 2 weeks or longer. If adhesive strip edges start to loosen and curl up, you may trim the loose edges. Do not remove adhesive strips completely unless your health care provider tells you to do that. °· Check your insertion site every day for signs of infection. Check for: °? Redness, swelling, or pain. °? Fluid or blood. °? Pus or a bad smell. °? Warmth. °· Do not take baths, swim, or use a hot tub until your health care provider approves. °· You may shower 24-48 hours after the procedure, or as directed by your health care provider. °? Remove the dressing and gently wash the site with plain soap and water. °? Pat the area dry with a clean towel. °? Do not rub the site. That could cause bleeding. °· Do not apply powder or lotion to the site. °Activity ° °· For 24 hours after the procedure, or as directed by your health care provider: °? Do not flex or bend the affected arm. °? Do not push or pull heavy objects with the affected arm. °? Do not  drive yourself home from the hospital or clinic. You may drive 24 hours after the procedure unless your health care provider tells you not to. °? Do not operate machinery or power tools. °· Do not lift anything that is heavier than 10 lb (4.5 kg), or the limit that you are told, until your health care provider says that it is safe. °· Ask your health care provider when it is okay to: °? Return to work or school. °? Resume usual physical activities or sports. °? Resume sexual activity. °General instructions °· If the catheter site starts to bleed, raise your arm and put firm pressure on the site. If the bleeding does not stop, get help right away. This is a medical emergency. °· If you went home on the same day as your procedure, a responsible adult should be with you for the first 24 hours after you arrive home. °· Keep all follow-up visits as told by your health care provider. This is important. °Contact a health care provider if: °· You have a fever. °· You have redness, swelling, or yellow drainage around your insertion site. °Get help right away if: °· You have unusual pain at the radial site. °· The catheter insertion area swells very fast. °· The insertion area is bleeding, and the bleeding does not stop when you hold steady pressure on the area. °· Your arm or hand becomes pale, cool, tingly, or numb. °These symptoms may represent a serious problem   that is an emergency. Do not wait to see if the symptoms will go away. Get medical help right away. Call your local emergency services (911 in the [place] Do not drive yourself to the hospital. °Summary °· After the procedure, it is common to have bruising and tenderness at the site. °· Follow instructions from your health care provider about how to take care of your radial site wound. Check the wound every day for signs of infection. °· Do not lift anything that is heavier than 10 lb (4.5 kg), or the limit that you are told, until your health care provider says  that it is safe. °This information is not intended to replace advice given to you by your health care provider. Make sure you discuss any questions you have with your health care provider. °Document Released: 02/09/2010 Document Revised: 02/12/2017 Document Reviewed: 02/12/2017 °Elsevier Patient Education © 2020 [employer] ° °

## 2018-11-10 ENCOUNTER — Encounter (HOSPITAL_COMMUNITY): Payer: Self-pay | Admitting: Cardiology

## 2018-12-09 ENCOUNTER — Other Ambulatory Visit: Payer: Self-pay

## 2018-12-09 ENCOUNTER — Encounter: Payer: Self-pay | Admitting: Cardiology

## 2018-12-09 ENCOUNTER — Ambulatory Visit (INDEPENDENT_AMBULATORY_CARE_PROVIDER_SITE_OTHER): Payer: Medicare Other | Admitting: Cardiology

## 2018-12-09 VITALS — BP 162/80 | HR 82 | Ht 61.0 in | Wt 162.0 lb

## 2018-12-09 DIAGNOSIS — R0789 Other chest pain: Secondary | ICD-10-CM | POA: Diagnosis not present

## 2018-12-09 DIAGNOSIS — R9439 Abnormal result of other cardiovascular function study: Secondary | ICD-10-CM

## 2018-12-09 DIAGNOSIS — E119 Type 2 diabetes mellitus without complications: Secondary | ICD-10-CM

## 2018-12-09 DIAGNOSIS — I10 Essential (primary) hypertension: Secondary | ICD-10-CM | POA: Diagnosis not present

## 2018-12-09 DIAGNOSIS — R002 Palpitations: Secondary | ICD-10-CM

## 2018-12-09 NOTE — Patient Instructions (Signed)
Medication Instructions:  Your physician recommends that you continue on your current medications as directed. Please refer to the Current Medication list given to you today.  *If you need a refill on your cardiac medications before your next appointment, please call your pharmacy*  Lab Work: None.  If you have labs (blood work) drawn today and your tests are completely normal, you will receive your results only by: Marland Kitchen MyChart Message (if you have MyChart) OR . A paper copy in the mail If you have any lab test that is abnormal or we need to change your treatment, we will call you to review the results.  Testing/Procedures: None.   Follow-Up: At [facility], you and your health needs are our priority.  As part of our continuing mission to provide you with exceptional heart care, we have created designated Provider Care Teams.  These Care Teams include your primary Cardiologist (physician) and Advanced Practice Providers (APPs -  Physician Assistants and Nurse Practitioners) who all work together to provide you with the care you need, when you need it.  Your next appointment:   1 year(s)  The format for your next appointment:   In Person  Provider:   You may see Dr. Agustin Cree or the following Advanced Practice Provider on your designated Care Team:    Laurann Montana, FNP   Other Instructions

## 2018-12-09 NOTE — Progress Notes (Signed)
Cardiology Office Note:    Date:  12/09/2018   ID:  Stefanie Erickson, DOB [birth year], MRN [id number]  PCP:  Cyndi Bender, PA-C  Cardiologist:  Jenne Campus, MD    Referring MD: Cyndi Bender, PA-C   Chief Complaint  Patient presents with  . Follow-up    History of Present Illness:    Stefanie Erickson is a 68 y.o. female who was referred to [place] because of shortness of breath.  Echocardiogram was done which showed preserved ejection fraction, stress test done showed ischemia involving apex because of multiple risk factors for coronary artery disease we decided to proceed with cardiac catheterization.  Cardiac catheterization has been done which showed normal coronary arteries with normal LVEDP and normal ejection fraction.  Based on that there is no cardiac explanation for her shortness of breath.  She comes today to talk about that.  She is very happy and pleased with the results of cardiac catheterization as well as with the care that she received about is gone.  Still complain of having some exertional shortness of breath.  I told her that now she need to have her lung tested she probably will require pulmonary function test on a [place] CT of her chest.  That will be done by her primary care physician.  In the meantime we need to still concentrate on risk factors modifications meaning her diabetes need to be well controlled, we need to make sure her cholesterol is well controlled.  Past Medical History:  Diagnosis Date  . Diabetes mellitus without complication ([place])   . Gall stones   . Hyperlipidemia   . Hypertension     Past Surgical History:  Procedure Laterality Date  . CHOLECYSTECTOMY    . LEFT HEART CATH AND CORONARY ANGIOGRAPHY N/A 11/09/2018   Procedure: LEFT HEART CATH AND CORONARY ANGIOGRAPHY;  Surgeon: Martinique, Peter M, MD;  Location: [facility];  Service: Cardiovascular;  Laterality: N/A;  . TUBAL LIGATION      Current Medications: Current Meds   Medication Sig  . aspirin EC 81 MG tablet Take 81 mg by mouth daily.  . Aspirin-Caffeine (BC FAST PAIN RELIEF PO) Take 1 packet by mouth daily as needed (pain).  . Calcium Carb-Cholecalciferol (CALCIUM 600 + D PO) Take 1 tablet by mouth 2 (two) times daily.  Marland Kitchen gabapentin (NEURONTIN) 100 MG capsule Take 100 mg by mouth at bedtime.   Marland Kitchen glipiZIDE (GLUCOTROL XL) 5 MG 24 hr tablet Take 5 mg by mouth every evening.   Marland Kitchen lisinopril (ZESTRIL) 10 MG tablet Take 1 tablet (10 mg total) by mouth daily.  Marland Kitchen lovastatin (MEVACOR) 20 MG tablet Take 20 mg by mouth at bedtime.   . metFORMIN (GLUCOPHAGE) 1000 MG tablet Take 1 tablet (1,000 mg total) by mouth 2 (two) times daily.  . metoprolol tartrate (LOPRESSOR) 25 MG tablet Take 25 mg by mouth 2 (two) times daily.   Marland Kitchen omeprazole (PRILOSEC) 20 MG capsule Take 20 mg by mouth every evening.   . tolnaftate (TINACTIN) 1 % spray Apply 1 application topically daily as needed (burning in feet).     Allergies:   Patient has no known allergies.   Social History   Socioeconomic History  . Marital status: Married    Spouse name: Not on file  . Number of children: Not on file  . Years of education: Not on file  . Highest education level: Not on file  Occupational History  . Not on file  Social Needs  .  Financial resource strain: Not on file  . Food insecurity    Worry: Not on file    Inability: Not on file  . Transportation needs    Medical: Not on file    Non-medical: Not on file  Tobacco Use  . Smoking status: Never Smoker  . Smokeless tobacco: Never Used  Substance and Sexual Activity  . Alcohol use: No  . Drug use: No  . Sexual activity: Not on file  Lifestyle  . Physical activity    Days per week: Not on file    Minutes per session: Not on file  . Stress: Not on file  Relationships  . Social Herbalist on phone: Not on file    Gets together: Not on file    Attends religious service: Not on file    Active member of club or  organization: Not on file    Attends meetings of clubs or organizations: Not on file    Relationship status: Not on file  Other Topics Concern  . Not on file  Social History Narrative  . Not on file     Family History: The patient's family history includes COPD in her mother; Colon cancer in her mother; Heart attack in her father; Heart disease in her mother; Renal Disease in her mother. ROS:   Please see the history of present illness.    All 14 point review of systems negative except as described per history of present illness  EKGs/Labs/Other Studies Reviewed:      Recent Labs: 09/25/2018: NT-Pro BNP 47; TSH 0.883 11/04/2018: Hemoglobin 13.6; Platelets 266 11/09/2018: BUN 19; Creatinine, Ser 1.18; Potassium 3.7; Sodium 140  Recent Lipid Panel No results found for: CHOL, TRIG, HDL, CHOLHDL, VLDL, LDLCALC, LDLDIRECT  Physical Exam:    VS:  BP (!) 162/80   Pulse 82   Ht 5\' 1"  (1.549 m)   Wt 162 lb (73.5 kg)   SpO2 98%   BMI 30.61 kg/m     Wt Readings from Last 3 Encounters:  12/09/18 162 lb (73.5 kg)  11/09/18 159 lb (72.1 kg)  11/04/18 159 lb 6.4 oz (72.3 kg)     GEN:  Well nourished, well developed in no acute distress HEENT: Normal NECK: No JVD; No carotid bruits LYMPHATICS: No lymphadenopathy CARDIAC: RRR, no murmurs, no rubs, no gallops RESPIRATORY:  Clear to auscultation without rales, wheezing or rhonchi  ABDOMEN: Soft, non-tender, non-distended MUSCULOSKELETAL:  No edema; No deformity  SKIN: Warm and dry LOWER EXTREMITIES: no swelling NEUROLOGIC:  Alert and oriented x 3 PSYCHIATRIC:  Normal affect   ASSESSMENT:    1. Essential hypertension   2. Type 2 diabetes mellitus without complication, without long-term current use of insulin (HCC)   3. Palpitations   4. 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 Atypical chest pain    PLAN:    In order of problems listed above:  1. Essential hypertension elevated today but she said she is very  nervous.  At home when she check her blood pressure is always less than one 140/90.  We will continue present management. 2. Type 2 diabetes followed by internal medicine team. 3. Palpitations denies having any 4. Abnormal stress test but cardiac catheterization was normal 5. Atypical chest pain not related to her heart.  Overall doing well continue present management.  Risk factors modification need to be emphasized.  We did talk about exercises as well as good diet.  See her back in  my office in 1 year or sooner if she has a problem   Medication Adjustments/Labs and Tests Ordered: Current medicines are reviewed at length with the patient today.  Concerns regarding medicines are outlined above.  No orders of the defined types were placed in this encounter.  Medication changes: No orders of the defined types were placed in this encounter.   Signed, Park Liter, MD, [facility] 12/09/2018 4:13 PM    [place]

## 2019-01-04 ENCOUNTER — Ambulatory Visit: Payer: Medicare Other | Admitting: Cardiology

## 2019-03-19 ENCOUNTER — Ambulatory Visit: Payer: Medicare Other | Attending: Internal Medicine

## 2019-03-19 DIAGNOSIS — Z23 Encounter for immunization: Secondary | ICD-10-CM | POA: Insufficient documentation

## 2019-03-19 NOTE — Progress Notes (Signed)
   Covid-19 Vaccination Clinic  Name:  Stefanie Erickson    MRN: [id number] DOB: [birth year]  03/19/2019  [name] was observed post Covid-19 immunization for 15 minutes without incidence. She was provided with Vaccine Information Sheet and instruction to access the V-Safe system.   [name] was instructed to call 911 with any severe reactions post vaccine: Marland Kitchen Difficulty breathing  . Swelling of your face and throat  . A fast heartbeat  . A bad rash all over your body  . Dizziness and weakness    Immunizations Administered    Name Date Dose VIS Date Route   Pfizer COVID-19 Vaccine 03/19/2019  2:59 PM 0.3 mL 01/01/2019 Intramuscular   Manufacturer: [place]   Lot: [id number]   [place]: [id number]

## 2019-04-14 ENCOUNTER — Ambulatory Visit: Payer: Medicare Other | Attending: Internal Medicine

## 2019-04-14 DIAGNOSIS — Z23 Encounter for immunization: Secondary | ICD-10-CM

## 2019-04-14 NOTE — Progress Notes (Signed)
   Covid-19 Vaccination Clinic  Name:  Stefanie Erickson    MRN: [id number] DOB: 04/05/1950  04/14/2019  [name] was observed post Covid-19 immunization for 15 minutes without incident. She was provided with Vaccine Information Sheet and instruction to access the V-Safe system.   [name] was instructed to call 911 with any severe reactions post vaccine: Marland Kitchen Difficulty breathing  . Swelling of face and throat  . A fast heartbeat  . A bad rash all over body  . Dizziness and weakness   Immunizations Administered    Name Date Dose VIS Date Route   Pfizer COVID-19 Vaccine 04/14/2019  3:11 PM 0.3 mL 01/01/2019 Intramuscular   Manufacturer: [place]   Lot: [id number]   [place]: [id number]

## 2019-05-09 ENCOUNTER — Other Ambulatory Visit: Payer: Self-pay | Admitting: Cardiology

## 2019-05-13 DIAGNOSIS — E118 Type 2 diabetes mellitus with unspecified complications: Secondary | ICD-10-CM | POA: Diagnosis not present

## 2019-05-13 DIAGNOSIS — G629 Polyneuropathy, unspecified: Secondary | ICD-10-CM | POA: Diagnosis not present

## 2019-05-13 DIAGNOSIS — E119 Type 2 diabetes mellitus without complications: Secondary | ICD-10-CM | POA: Diagnosis not present

## 2019-05-13 DIAGNOSIS — E785 Hyperlipidemia, unspecified: Secondary | ICD-10-CM | POA: Diagnosis not present

## 2019-05-13 DIAGNOSIS — I10 Essential (primary) hypertension: Secondary | ICD-10-CM | POA: Diagnosis not present

## 2019-08-11 DIAGNOSIS — G4733 Obstructive sleep apnea (adult) (pediatric): Secondary | ICD-10-CM | POA: Diagnosis not present

## 2019-08-11 DIAGNOSIS — R0602 Shortness of breath: Secondary | ICD-10-CM | POA: Diagnosis not present

## 2019-08-12 DIAGNOSIS — G4733 Obstructive sleep apnea (adult) (pediatric): Secondary | ICD-10-CM | POA: Diagnosis not present

## 2019-08-12 DIAGNOSIS — R0602 Shortness of breath: Secondary | ICD-10-CM | POA: Diagnosis not present

## 2019-12-09 DIAGNOSIS — Z23 Encounter for immunization: Secondary | ICD-10-CM | POA: Diagnosis not present

## 2019-12-29 DIAGNOSIS — J309 Allergic rhinitis, unspecified: Secondary | ICD-10-CM | POA: Diagnosis not present

## 2019-12-29 DIAGNOSIS — G629 Polyneuropathy, unspecified: Secondary | ICD-10-CM | POA: Diagnosis not present

## 2019-12-29 DIAGNOSIS — Z6827 Body mass index (BMI) 27.0-27.9, adult: Secondary | ICD-10-CM | POA: Diagnosis not present

## 2019-12-29 DIAGNOSIS — Z9181 History of falling: Secondary | ICD-10-CM | POA: Diagnosis not present

## 2019-12-29 DIAGNOSIS — I10 Essential (primary) hypertension: Secondary | ICD-10-CM | POA: Diagnosis not present

## 2019-12-29 DIAGNOSIS — Z139 Encounter for screening, unspecified: Secondary | ICD-10-CM | POA: Diagnosis not present

## 2019-12-29 DIAGNOSIS — E118 Type 2 diabetes mellitus with unspecified complications: Secondary | ICD-10-CM | POA: Diagnosis not present

## 2019-12-29 DIAGNOSIS — E785 Hyperlipidemia, unspecified: Secondary | ICD-10-CM | POA: Diagnosis not present

## 2019-12-29 DIAGNOSIS — Z1331 Encounter for screening for depression: Secondary | ICD-10-CM | POA: Diagnosis not present

## 2020-06-11 DIAGNOSIS — Z79899 Other long term (current) drug therapy: Secondary | ICD-10-CM | POA: Diagnosis not present

## 2020-06-11 DIAGNOSIS — Z792 Long term (current) use of antibiotics: Secondary | ICD-10-CM | POA: Diagnosis not present

## 2020-06-11 DIAGNOSIS — R519 Headache, unspecified: Secondary | ICD-10-CM | POA: Diagnosis not present

## 2020-06-11 DIAGNOSIS — E785 Hyperlipidemia, unspecified: Secondary | ICD-10-CM | POA: Diagnosis not present

## 2020-06-11 DIAGNOSIS — E7800 Pure hypercholesterolemia, unspecified: Secondary | ICD-10-CM | POA: Diagnosis present

## 2020-06-11 DIAGNOSIS — Z7982 Long term (current) use of aspirin: Secondary | ICD-10-CM | POA: Diagnosis not present

## 2020-06-11 DIAGNOSIS — I161 Hypertensive emergency: Secondary | ICD-10-CM | POA: Diagnosis not present

## 2020-06-11 DIAGNOSIS — J322 Chronic ethmoidal sinusitis: Secondary | ICD-10-CM | POA: Diagnosis not present

## 2020-06-11 DIAGNOSIS — R079 Chest pain, unspecified: Secondary | ICD-10-CM | POA: Diagnosis not present

## 2020-06-11 DIAGNOSIS — I160 Hypertensive urgency: Secondary | ICD-10-CM | POA: Diagnosis not present

## 2020-06-11 DIAGNOSIS — I517 Cardiomegaly: Secondary | ICD-10-CM | POA: Diagnosis not present

## 2020-06-11 DIAGNOSIS — R0789 Other chest pain: Secondary | ICD-10-CM | POA: Diagnosis present

## 2020-06-11 DIAGNOSIS — G43009 Migraine without aura, not intractable, without status migrainosus: Secondary | ICD-10-CM | POA: Diagnosis present

## 2020-06-11 DIAGNOSIS — E876 Hypokalemia: Secondary | ICD-10-CM | POA: Diagnosis present

## 2020-06-11 DIAGNOSIS — M549 Dorsalgia, unspecified: Secondary | ICD-10-CM | POA: Diagnosis not present

## 2020-06-11 DIAGNOSIS — E119 Type 2 diabetes mellitus without complications: Secondary | ICD-10-CM | POA: Diagnosis not present

## 2020-06-11 DIAGNOSIS — R0603 Acute respiratory distress: Secondary | ICD-10-CM | POA: Diagnosis not present

## 2020-06-11 DIAGNOSIS — R42 Dizziness and giddiness: Secondary | ICD-10-CM | POA: Diagnosis not present

## 2020-06-11 DIAGNOSIS — I10 Essential (primary) hypertension: Secondary | ICD-10-CM | POA: Diagnosis not present

## 2020-06-12 DIAGNOSIS — I160 Hypertensive urgency: Secondary | ICD-10-CM

## 2020-06-15 DIAGNOSIS — I161 Hypertensive emergency: Secondary | ICD-10-CM | POA: Diagnosis not present

## 2020-06-15 DIAGNOSIS — Z7982 Long term (current) use of aspirin: Secondary | ICD-10-CM | POA: Diagnosis not present

## 2020-06-15 DIAGNOSIS — Z7984 Long term (current) use of oral hypoglycemic drugs: Secondary | ICD-10-CM | POA: Diagnosis not present

## 2020-06-15 DIAGNOSIS — E119 Type 2 diabetes mellitus without complications: Secondary | ICD-10-CM | POA: Diagnosis not present

## 2020-06-15 DIAGNOSIS — E785 Hyperlipidemia, unspecified: Secondary | ICD-10-CM | POA: Diagnosis not present

## 2020-06-15 DIAGNOSIS — Z79899 Other long term (current) drug therapy: Secondary | ICD-10-CM | POA: Diagnosis not present

## 2020-06-15 DIAGNOSIS — I10 Essential (primary) hypertension: Secondary | ICD-10-CM | POA: Diagnosis not present

## 2020-06-15 DIAGNOSIS — R42 Dizziness and giddiness: Secondary | ICD-10-CM | POA: Diagnosis not present

## 2020-06-15 DIAGNOSIS — G43909 Migraine, unspecified, not intractable, without status migrainosus: Secondary | ICD-10-CM | POA: Diagnosis not present

## 2020-06-15 DIAGNOSIS — E7800 Pure hypercholesterolemia, unspecified: Secondary | ICD-10-CM | POA: Diagnosis not present

## 2020-06-15 DIAGNOSIS — J322 Chronic ethmoidal sinusitis: Secondary | ICD-10-CM | POA: Diagnosis not present

## 2020-06-15 DIAGNOSIS — Z792 Long term (current) use of antibiotics: Secondary | ICD-10-CM | POA: Diagnosis not present

## 2020-06-26 DIAGNOSIS — I10 Essential (primary) hypertension: Secondary | ICD-10-CM | POA: Diagnosis not present

## 2020-06-26 DIAGNOSIS — G629 Polyneuropathy, unspecified: Secondary | ICD-10-CM | POA: Diagnosis not present

## 2020-06-26 DIAGNOSIS — Z79899 Other long term (current) drug therapy: Secondary | ICD-10-CM | POA: Diagnosis not present

## 2020-06-26 DIAGNOSIS — J322 Chronic ethmoidal sinusitis: Secondary | ICD-10-CM | POA: Diagnosis not present

## 2020-06-26 DIAGNOSIS — E118 Type 2 diabetes mellitus with unspecified complications: Secondary | ICD-10-CM | POA: Diagnosis not present

## 2020-06-26 DIAGNOSIS — Z6828 Body mass index (BMI) 28.0-28.9, adult: Secondary | ICD-10-CM | POA: Diagnosis not present

## 2020-06-26 DIAGNOSIS — M79645 Pain in left finger(s): Secondary | ICD-10-CM | POA: Diagnosis not present

## 2020-07-05 DIAGNOSIS — I161 Hypertensive emergency: Secondary | ICD-10-CM | POA: Diagnosis not present

## 2020-07-05 DIAGNOSIS — E7800 Pure hypercholesterolemia, unspecified: Secondary | ICD-10-CM | POA: Diagnosis not present

## 2020-07-05 DIAGNOSIS — J322 Chronic ethmoidal sinusitis: Secondary | ICD-10-CM | POA: Diagnosis not present

## 2020-07-05 DIAGNOSIS — E785 Hyperlipidemia, unspecified: Secondary | ICD-10-CM | POA: Diagnosis not present

## 2020-07-05 DIAGNOSIS — E119 Type 2 diabetes mellitus without complications: Secondary | ICD-10-CM | POA: Diagnosis not present

## 2020-07-05 DIAGNOSIS — I10 Essential (primary) hypertension: Secondary | ICD-10-CM | POA: Diagnosis not present

## 2020-07-12 DIAGNOSIS — I161 Hypertensive emergency: Secondary | ICD-10-CM | POA: Diagnosis not present

## 2020-07-12 DIAGNOSIS — I10 Essential (primary) hypertension: Secondary | ICD-10-CM | POA: Diagnosis not present

## 2020-07-12 DIAGNOSIS — E7800 Pure hypercholesterolemia, unspecified: Secondary | ICD-10-CM | POA: Diagnosis not present

## 2020-07-12 DIAGNOSIS — E119 Type 2 diabetes mellitus without complications: Secondary | ICD-10-CM | POA: Diagnosis not present

## 2020-07-12 DIAGNOSIS — J322 Chronic ethmoidal sinusitis: Secondary | ICD-10-CM | POA: Diagnosis not present

## 2020-07-12 DIAGNOSIS — E785 Hyperlipidemia, unspecified: Secondary | ICD-10-CM | POA: Diagnosis not present

## 2020-07-15 DIAGNOSIS — G43909 Migraine, unspecified, not intractable, without status migrainosus: Secondary | ICD-10-CM | POA: Diagnosis not present

## 2020-07-15 DIAGNOSIS — E785 Hyperlipidemia, unspecified: Secondary | ICD-10-CM | POA: Diagnosis not present

## 2020-07-15 DIAGNOSIS — R42 Dizziness and giddiness: Secondary | ICD-10-CM | POA: Diagnosis not present

## 2020-07-15 DIAGNOSIS — Z7982 Long term (current) use of aspirin: Secondary | ICD-10-CM | POA: Diagnosis not present

## 2020-07-15 DIAGNOSIS — Z79899 Other long term (current) drug therapy: Secondary | ICD-10-CM | POA: Diagnosis not present

## 2020-07-15 DIAGNOSIS — I161 Hypertensive emergency: Secondary | ICD-10-CM | POA: Diagnosis not present

## 2020-07-15 DIAGNOSIS — E7800 Pure hypercholesterolemia, unspecified: Secondary | ICD-10-CM | POA: Diagnosis not present

## 2020-07-15 DIAGNOSIS — Z7984 Long term (current) use of oral hypoglycemic drugs: Secondary | ICD-10-CM | POA: Diagnosis not present

## 2020-07-15 DIAGNOSIS — Z792 Long term (current) use of antibiotics: Secondary | ICD-10-CM | POA: Diagnosis not present

## 2020-07-15 DIAGNOSIS — I10 Essential (primary) hypertension: Secondary | ICD-10-CM | POA: Diagnosis not present

## 2020-07-15 DIAGNOSIS — J322 Chronic ethmoidal sinusitis: Secondary | ICD-10-CM | POA: Diagnosis not present

## 2020-07-15 DIAGNOSIS — E119 Type 2 diabetes mellitus without complications: Secondary | ICD-10-CM | POA: Diagnosis not present

## 2020-07-19 DIAGNOSIS — J322 Chronic ethmoidal sinusitis: Secondary | ICD-10-CM | POA: Diagnosis not present

## 2020-07-19 DIAGNOSIS — E119 Type 2 diabetes mellitus without complications: Secondary | ICD-10-CM | POA: Diagnosis not present

## 2020-07-19 DIAGNOSIS — I10 Essential (primary) hypertension: Secondary | ICD-10-CM | POA: Diagnosis not present

## 2020-07-19 DIAGNOSIS — E7800 Pure hypercholesterolemia, unspecified: Secondary | ICD-10-CM | POA: Diagnosis not present

## 2020-07-19 DIAGNOSIS — E785 Hyperlipidemia, unspecified: Secondary | ICD-10-CM | POA: Diagnosis not present

## 2020-07-19 DIAGNOSIS — I161 Hypertensive emergency: Secondary | ICD-10-CM | POA: Diagnosis not present

## 2020-07-20 DIAGNOSIS — E785 Hyperlipidemia, unspecified: Secondary | ICD-10-CM | POA: Diagnosis not present

## 2020-07-20 DIAGNOSIS — Z0000 Encounter for general adult medical examination without abnormal findings: Secondary | ICD-10-CM | POA: Diagnosis not present

## 2020-07-20 DIAGNOSIS — Z139 Encounter for screening, unspecified: Secondary | ICD-10-CM | POA: Diagnosis not present

## 2020-07-20 DIAGNOSIS — Z1331 Encounter for screening for depression: Secondary | ICD-10-CM | POA: Diagnosis not present

## 2020-07-20 DIAGNOSIS — Z9181 History of falling: Secondary | ICD-10-CM | POA: Diagnosis not present

## 2020-07-26 DIAGNOSIS — I161 Hypertensive emergency: Secondary | ICD-10-CM | POA: Diagnosis not present

## 2020-07-26 DIAGNOSIS — J322 Chronic ethmoidal sinusitis: Secondary | ICD-10-CM | POA: Diagnosis not present

## 2020-07-26 DIAGNOSIS — E7800 Pure hypercholesterolemia, unspecified: Secondary | ICD-10-CM | POA: Diagnosis not present

## 2020-07-26 DIAGNOSIS — I10 Essential (primary) hypertension: Secondary | ICD-10-CM | POA: Diagnosis not present

## 2020-07-26 DIAGNOSIS — E119 Type 2 diabetes mellitus without complications: Secondary | ICD-10-CM | POA: Diagnosis not present

## 2020-07-26 DIAGNOSIS — E785 Hyperlipidemia, unspecified: Secondary | ICD-10-CM | POA: Diagnosis not present

## 2020-08-02 DIAGNOSIS — I10 Essential (primary) hypertension: Secondary | ICD-10-CM | POA: Diagnosis not present

## 2020-08-02 DIAGNOSIS — I161 Hypertensive emergency: Secondary | ICD-10-CM | POA: Diagnosis not present

## 2020-08-02 DIAGNOSIS — J322 Chronic ethmoidal sinusitis: Secondary | ICD-10-CM | POA: Diagnosis not present

## 2020-08-02 DIAGNOSIS — E119 Type 2 diabetes mellitus without complications: Secondary | ICD-10-CM | POA: Diagnosis not present

## 2020-08-02 DIAGNOSIS — E785 Hyperlipidemia, unspecified: Secondary | ICD-10-CM | POA: Diagnosis not present

## 2020-08-02 DIAGNOSIS — E7800 Pure hypercholesterolemia, unspecified: Secondary | ICD-10-CM | POA: Diagnosis not present

## 2020-08-05 IMAGING — DX DG CHEST 2V
2 series · 2 of 2 positions shown · non-contrast
Comparison: 04/19/2016

CLINICAL DATA: Central chest pain with SOB x 3 months. Hx HTN, DM.

EXAM:
CHEST - 2 VIEW

[chest pa]
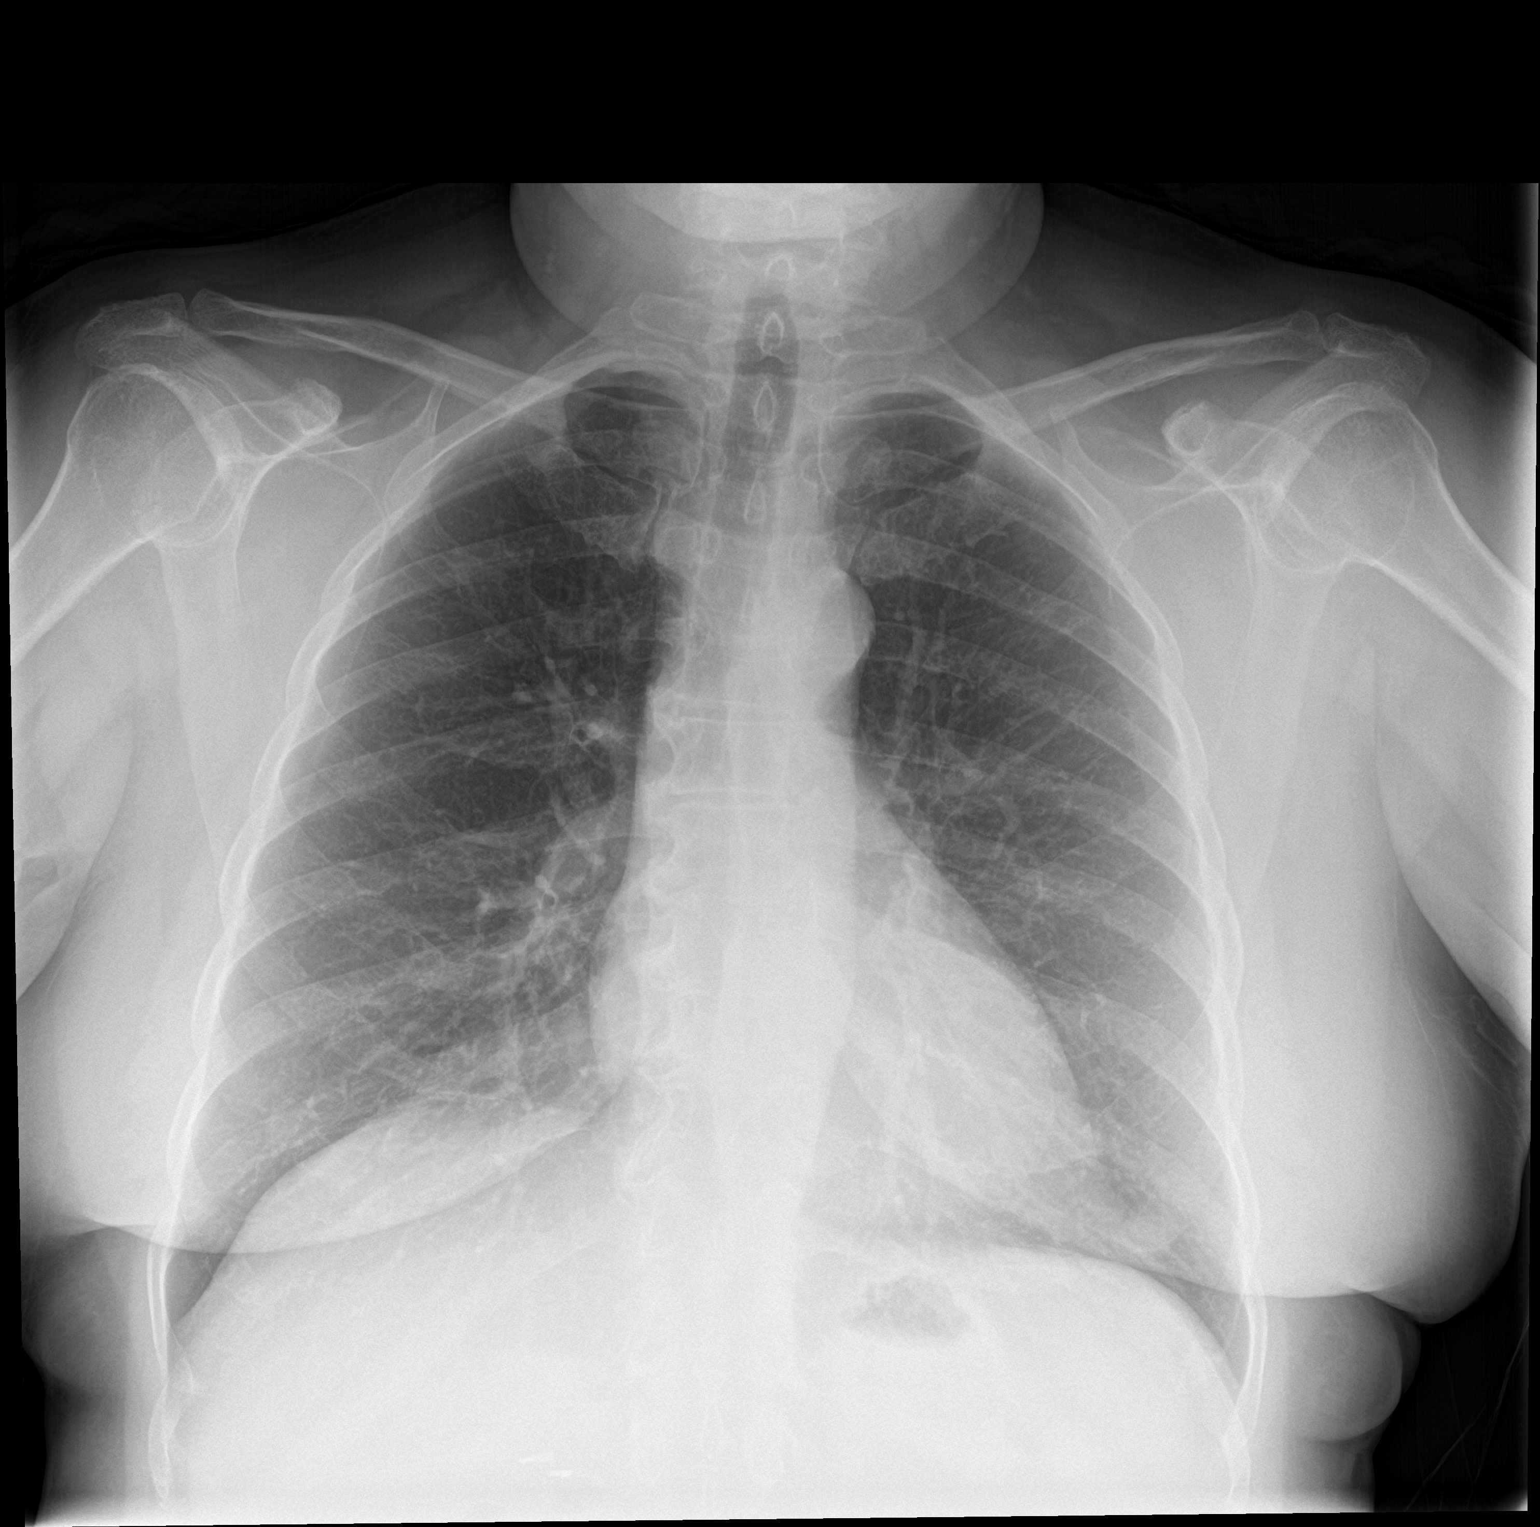

[chest lat]
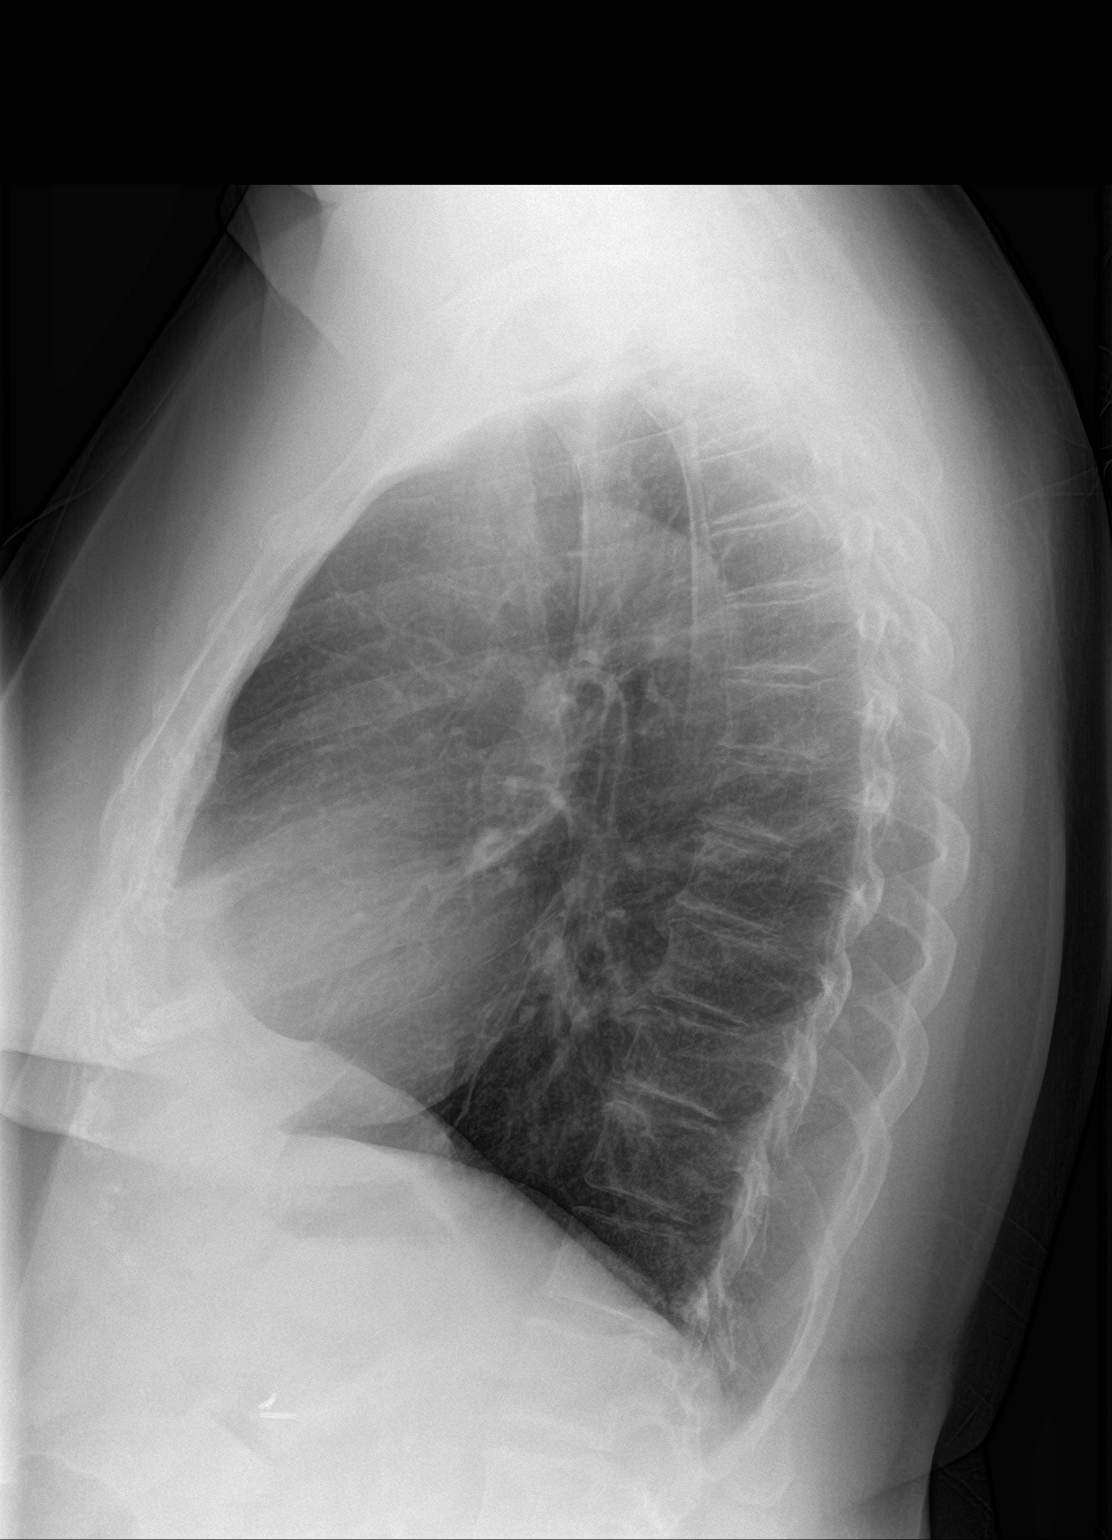

[2 of 2 positions shown; findings below may reference images not displayed]

FINDINGS: The heart size and mediastinal contours are within normal limits.
Both lungs are clear. The visualized skeletal structures are
unremarkable.
IMPRESSION: No active cardiopulmonary disease.

## 2020-08-09 DIAGNOSIS — E7800 Pure hypercholesterolemia, unspecified: Secondary | ICD-10-CM | POA: Diagnosis not present

## 2020-08-09 DIAGNOSIS — E785 Hyperlipidemia, unspecified: Secondary | ICD-10-CM | POA: Diagnosis not present

## 2020-08-09 DIAGNOSIS — J322 Chronic ethmoidal sinusitis: Secondary | ICD-10-CM | POA: Diagnosis not present

## 2020-08-09 DIAGNOSIS — I10 Essential (primary) hypertension: Secondary | ICD-10-CM | POA: Diagnosis not present

## 2020-08-09 DIAGNOSIS — I161 Hypertensive emergency: Secondary | ICD-10-CM | POA: Diagnosis not present

## 2020-08-09 DIAGNOSIS — E119 Type 2 diabetes mellitus without complications: Secondary | ICD-10-CM | POA: Diagnosis not present

## 2020-08-25 DIAGNOSIS — M8589 Other specified disorders of bone density and structure, multiple sites: Secondary | ICD-10-CM | POA: Diagnosis not present

## 2020-08-25 DIAGNOSIS — M810 Age-related osteoporosis without current pathological fracture: Secondary | ICD-10-CM | POA: Diagnosis not present

## 2020-08-25 DIAGNOSIS — Z1231 Encounter for screening mammogram for malignant neoplasm of breast: Secondary | ICD-10-CM | POA: Diagnosis not present

## 2020-08-25 DIAGNOSIS — N959 Unspecified menopausal and perimenopausal disorder: Secondary | ICD-10-CM | POA: Diagnosis not present

## 2020-09-27 DIAGNOSIS — I10 Essential (primary) hypertension: Secondary | ICD-10-CM | POA: Diagnosis not present

## 2020-09-27 DIAGNOSIS — R519 Headache, unspecified: Secondary | ICD-10-CM | POA: Diagnosis not present

## 2020-09-27 DIAGNOSIS — E119 Type 2 diabetes mellitus without complications: Secondary | ICD-10-CM | POA: Diagnosis not present

## 2020-09-28 DIAGNOSIS — I10 Essential (primary) hypertension: Secondary | ICD-10-CM | POA: Diagnosis not present

## 2020-09-28 DIAGNOSIS — R519 Headache, unspecified: Secondary | ICD-10-CM | POA: Diagnosis not present

## 2020-11-21 DIAGNOSIS — Z23 Encounter for immunization: Secondary | ICD-10-CM | POA: Diagnosis not present

## 2021-01-31 DIAGNOSIS — I10 Essential (primary) hypertension: Secondary | ICD-10-CM | POA: Diagnosis not present

## 2021-01-31 DIAGNOSIS — R42 Dizziness and giddiness: Secondary | ICD-10-CM | POA: Diagnosis not present

## 2021-01-31 NOTE — ED Triage Notes (Addendum)
Per EMS, pt from home, she noticed that every time she stood up she got dizzy so she took her bp which was high.  Fire department also found it high.  No hx of dementia and is alert and oriented.    182/98 CBG 209 99% RA 66HR sinus  Pt reports during the beginning of the hypertension episodes she had substernal chest pain.  Chest pain has since resolved.

## 2021-02-01 ENCOUNTER — Other Ambulatory Visit: Payer: Self-pay

## 2021-02-01 ENCOUNTER — Emergency Department (HOSPITAL_COMMUNITY): Payer: Medicare HMO

## 2021-02-01 ENCOUNTER — Emergency Department (HOSPITAL_COMMUNITY)
Admission: EM | Admit: 2021-02-01 | Discharge: 2021-02-01 | Disposition: A | Discharge status: Home / Self Care | Payer: Medicare HMO | Attending: Emergency Medicine | Admitting: Emergency Medicine

## 2021-02-01 ENCOUNTER — Encounter (HOSPITAL_COMMUNITY): Payer: Self-pay | Admitting: Emergency Medicine

## 2021-02-01 DIAGNOSIS — R072 Precordial pain: Secondary | ICD-10-CM | POA: Insufficient documentation

## 2021-02-01 DIAGNOSIS — Z5321 Procedure and treatment not carried out due to patient leaving prior to being seen by health care provider: Secondary | ICD-10-CM | POA: Diagnosis not present

## 2021-02-01 DIAGNOSIS — I10 Essential (primary) hypertension: Secondary | ICD-10-CM | POA: Diagnosis not present

## 2021-02-01 DIAGNOSIS — R0789 Other chest pain: Secondary | ICD-10-CM | POA: Diagnosis not present

## 2021-02-01 DIAGNOSIS — R079 Chest pain, unspecified: Secondary | ICD-10-CM | POA: Diagnosis not present

## 2021-02-01 LAB — CBC WITH DIFFERENTIAL/PLATELET
Abs Immature Granulocytes: 0.02 10*3/uL (ref 0.00–0.07)
Basophils Absolute: 0.1 10*3/uL (ref 0.0–0.1)
Basophils Relative: 1 %
Eosinophils Absolute: 0.5 10*3/uL (ref 0.0–0.5)
Eosinophils Relative: 6 %
HCT: 43.2 % (ref 36.0–46.0)
Hemoglobin: 14.1 g/dL (ref 12.0–15.0)
Immature Granulocytes: 0 %
Lymphocytes Relative: 35 %
Lymphs Abs: 3 10*3/uL (ref 0.7–4.0)
MCH: 28.5 pg (ref 26.0–34.0)
MCHC: 32.6 g/dL (ref 30.0–36.0)
MCV: 87.3 fL (ref 80.0–100.0)
Monocytes Absolute: 0.5 10*3/uL (ref 0.1–1.0)
Monocytes Relative: 6 %
Neutro Abs: 4.3 10*3/uL (ref 1.7–7.7)
Neutrophils Relative %: 52 %
Platelets: 272 10*3/uL (ref 150–400)
RBC: 4.95 MIL/uL (ref 3.87–5.11)
RDW: 13 % (ref 11.5–15.5)
WBC: 8.4 10*3/uL (ref 4.0–10.5)
nRBC: 0 % (ref 0.0–0.2)

## 2021-02-01 LAB — BASIC METABOLIC PANEL
Anion gap: 12 (ref 5–15)
BUN: 16 mg/dL (ref 8–23)
CO2: 23 mmol/L (ref 22–32)
Calcium: 9.9 mg/dL (ref 8.9–10.3)
Chloride: 104 mmol/L (ref 98–111)
Creatinine, Ser: 0.92 mg/dL (ref 0.44–1.00)
GFR, Estimated: >60 mL/min (ref >60)
Glucose, Bld: 132 mg/dL — ABNORMAL HIGH (ref 70–99)
Potassium: 3.7 mmol/L (ref 3.5–5.1)
Sodium: 139 mmol/L (ref 135–145)

## 2021-02-01 LAB — TROPONIN I (HIGH SENSITIVITY)
Troponin I (High Sensitivity): 5 ng/L (ref <18)
Troponin I (High Sensitivity): 6 ng/L (ref <18)

## 2021-02-01 NOTE — ED Notes (Signed)
Patient refused temperature check 

## 2021-02-01 NOTE — ED Provider Triage Note (Signed)
Emergency Medicine Provider Triage Evaluation Note  Stefanie Erickson , a 71 y.o. female  was evaluated in triage.  Pt complains of high blood pressure and chest pain.  Patient reports this evening she checked her blood pressure and it was much higher than usual.  She took all of her evening blood pressure medications and went to recheck it but her cuff ran out of batteries.  Was worried about this and went to the fire department there was still elevated so they called EMS.  Patient does report over the past few days she has been having some intermittent substernal chest pains, currently not having any chest pain.  Had a headache earlier this evening that is since resolved.  No acute vision changes.  Denies lower extremity swelling or pain.  No other complaints.  Recently ran out of her lisinopril about 3 days ago but has been taking her other blood pressure medications regularly.  Review of Systems  Positive: Chest pain, headache Negative: Vision changes, shortness of breath, abdominal pain, lower extremity swelling.  Physical Exam  BP (!) 215/88 (BP Location: Left Arm)    Pulse 63    Temp 98.2 F (36.8 C) (Oral)    Resp 15    SpO2 98%  Gen:   Awake, no distress   Resp:  Normal effort, CTA bilaterally MSK:   Moves extremities without difficulty  Other:    Medical Decision Making  Medically screening exam initiated at 1:18 AM.  Appropriate orders placed.  Stefanie Erickson was informed that the remainder of the evaluation will be completed by another provider, this initial triage assessment does not replace that evaluation, and the importance of remaining in the ED until their evaluation is complete.  Work-up initiated for hypertension and chest pain.   Stefanie Erickson, [place] 02/01/21 [phone]

## 2021-02-06 DIAGNOSIS — I10 Essential (primary) hypertension: Secondary | ICD-10-CM | POA: Diagnosis not present

## 2021-02-06 DIAGNOSIS — G629 Polyneuropathy, unspecified: Secondary | ICD-10-CM | POA: Diagnosis not present

## 2021-02-06 DIAGNOSIS — Z23 Encounter for immunization: Secondary | ICD-10-CM | POA: Diagnosis not present

## 2021-02-06 DIAGNOSIS — E118 Type 2 diabetes mellitus with unspecified complications: Secondary | ICD-10-CM | POA: Diagnosis not present

## 2021-02-06 DIAGNOSIS — M7062 Trochanteric bursitis, left hip: Secondary | ICD-10-CM | POA: Diagnosis not present

## 2021-02-06 DIAGNOSIS — E785 Hyperlipidemia, unspecified: Secondary | ICD-10-CM | POA: Diagnosis not present

## 2021-02-06 DIAGNOSIS — M810 Age-related osteoporosis without current pathological fracture: Secondary | ICD-10-CM | POA: Diagnosis not present

## 2021-02-06 DIAGNOSIS — Z6828 Body mass index (BMI) 28.0-28.9, adult: Secondary | ICD-10-CM | POA: Diagnosis not present

## 2021-04-24 ENCOUNTER — Other Ambulatory Visit: Payer: Self-pay

## 2021-04-24 ENCOUNTER — Emergency Department (HOSPITAL_COMMUNITY)
Admission: EM | Admit: 2021-04-24 | Discharge: 2021-04-24 | Disposition: A | Discharge status: Home / Self Care | Payer: Medicare HMO | Attending: Emergency Medicine | Admitting: Emergency Medicine

## 2021-04-24 ENCOUNTER — Emergency Department (HOSPITAL_COMMUNITY): Payer: Medicare HMO

## 2021-04-24 ENCOUNTER — Encounter (HOSPITAL_COMMUNITY): Payer: Self-pay

## 2021-04-24 DIAGNOSIS — Z79899 Other long term (current) drug therapy: Secondary | ICD-10-CM | POA: Insufficient documentation

## 2021-04-24 DIAGNOSIS — Z7984 Long term (current) use of oral hypoglycemic drugs: Secondary | ICD-10-CM | POA: Insufficient documentation

## 2021-04-24 DIAGNOSIS — R55 Syncope and collapse: Secondary | ICD-10-CM | POA: Insufficient documentation

## 2021-04-24 DIAGNOSIS — Z7982 Long term (current) use of aspirin: Secondary | ICD-10-CM | POA: Diagnosis not present

## 2021-04-24 DIAGNOSIS — R7989 Other specified abnormal findings of blood chemistry: Secondary | ICD-10-CM | POA: Insufficient documentation

## 2021-04-24 DIAGNOSIS — I10 Essential (primary) hypertension: Secondary | ICD-10-CM | POA: Diagnosis not present

## 2021-04-24 DIAGNOSIS — R079 Chest pain, unspecified: Secondary | ICD-10-CM | POA: Diagnosis not present

## 2021-04-24 LAB — BASIC METABOLIC PANEL
Anion gap: 8 (ref 5–15)
BUN: 23 mg/dL (ref 8–23)
CO2: 23 mmol/L (ref 22–32)
Calcium: 9.1 mg/dL (ref 8.9–10.3)
Chloride: 108 mmol/L (ref 98–111)
Creatinine, Ser: 1.14 mg/dL — ABNORMAL HIGH (ref 0.44–1.00)
GFR, Estimated: 52 mL/min — ABNORMAL LOW (ref >60)
Glucose, Bld: 102 mg/dL — ABNORMAL HIGH (ref 70–99)
Potassium: 3.8 mmol/L (ref 3.5–5.1)
Sodium: 139 mmol/L (ref 135–145)

## 2021-04-24 LAB — URINALYSIS, ROUTINE W REFLEX MICROSCOPIC
Bilirubin Urine: NEGATIVE
Glucose, UA: NEGATIVE mg/dL
Hgb urine dipstick: NEGATIVE
Ketones, ur: NEGATIVE mg/dL
Nitrite: NEGATIVE
Protein, ur: NEGATIVE mg/dL
Specific Gravity, Urine: 1.021 (ref 1.005–1.030)
pH: 5 (ref 5.0–8.0)

## 2021-04-24 LAB — CBC
HCT: 39.6 % (ref 36.0–46.0)
Hemoglobin: 12.5 g/dL (ref 12.0–15.0)
MCH: 28.4 pg (ref 26.0–34.0)
MCHC: 31.6 g/dL (ref 30.0–36.0)
MCV: 90 fL (ref 80.0–100.0)
Platelets: 240 10*3/uL (ref 150–400)
RBC: 4.4 MIL/uL (ref 3.87–5.11)
RDW: 13 % (ref 11.5–15.5)
WBC: 10.9 10*3/uL — ABNORMAL HIGH (ref 4.0–10.5)
nRBC: 0 % (ref 0.0–0.2)

## 2021-04-24 LAB — TROPONIN I (HIGH SENSITIVITY)
Troponin I (High Sensitivity): 4 ng/L (ref <18)
Troponin I (High Sensitivity): 3 ng/L (ref <18)

## 2021-04-24 LAB — CBG MONITORING, ED: Glucose-Capillary: 106 mg/dL — ABNORMAL HIGH (ref 70–99)

## 2021-04-24 NOTE — ED Provider Notes (Signed)
?[facility] ?Provider Note ? ? ?CSN: [id number] ?Arrival date & time: 04/24/21  1741 ? ?  ? ?History ? ?Chief Complaint  ?Patient presents with  ? Near Syncope  ?  Presents via EMS with c/o near syncope, states she was walking to bathroom, became dizzy and call for help, family assisted her to floor. Became sweaty. Squad gave her a 500cc bolus of 0.9 NS.  ? ? ?Stefanie Erickson is a 71 y.o. female. ? ? ?Near Syncope ?Associated symptoms include chest pain. Pertinent negatives include no abdominal pain. Patient presents with near syncope.  Was walking to the bathroom.  Became dizzy.  Assist to the floor.  May have had a brief loss conscious but patient is not sure.  May have had some mild chest pain.  No trouble breathing.  Has been doing well otherwise.  States she had a's previous episode where she was dehydrated.  No fevers or chills.  No dysuria. ? ?  ? ?Home Medications ?Prior to Admission medications   ?Medication Sig Start Date End Date Taking? Authorizing Provider  ?aspirin EC 81 MG tablet Take 81 mg by mouth daily.    [provider]  ?Aspirin-Caffeine (BC FAST PAIN RELIEF PO) Take 1 packet by mouth daily as needed (pain).    [provider]  ?Calcium Carb-Cholecalciferol (CALCIUM 600 + D PO) Take 1 tablet by mouth 2 (two) times daily.    [provider]  ?gabapentin (NEURONTIN) 100 MG capsule Take 100 mg by mouth at bedtime.  06/12/18   [provider]  ?glipiZIDE (GLUCOTROL XL) 5 MG 24 hr tablet Take 5 mg by mouth every evening.  09/22/18   [provider]  ?lisinopril (ZESTRIL) 10 MG tablet TAKE 1 TABLET BY MOUTH EVERY DAY 05/11/19   Park Liter, MD  ?lovastatin (MEVACOR) 20 MG tablet Take 20 mg by mouth at bedtime.  09/11/18   [provider]  ?metFORMIN (GLUCOPHAGE) 1000 MG tablet Take 1 tablet (1,000 mg total) by mouth 2 (two) times daily. 11/12/18   Martinique, Peter M, MD  ?metoprolol tartrate (LOPRESSOR) 25 MG  tablet Take 25 mg by mouth 2 (two) times daily.  09/11/18   [provider]  ?omeprazole (PRILOSEC) 20 MG capsule Take 20 mg by mouth every evening.  09/22/18   [provider]  ?tolnaftate (TINACTIN) 1 % spray Apply 1 application topically daily as needed (burning in feet).    [provider]  ?   ? ?Allergies    ?Patient has no known allergies.   ? ?Review of Systems   ?Review of Systems  ?Constitutional:  Negative for appetite change.  ?HENT:  Negative for congestion.   ?Cardiovascular:  Positive for chest pain and near-syncope.  ?Gastrointestinal:  Negative for abdominal pain.  ?Genitourinary:  Negative for enuresis.  ?Musculoskeletal:  Negative for back pain.  ?Neurological:  Positive for light-headedness.  ?Psychiatric/Behavioral:  Negative for confusion.   ? ?Physical Exam ?Updated Vital Signs ?BP 130/73   Pulse 69   Temp 98 ?F (36.7 ?C)   Resp (!) 21   Ht '5\' 2"'$  (1.575 m)   Wt 71.2 kg   SpO2 99%   BMI 28.72 kg/m?  ?Physical Exam ?Vitals and nursing note reviewed.  ?Eyes:  ?   Pupils: Pupils are equal, round, and reactive to light.  ?Cardiovascular:  ?   Rate and Rhythm: Regular rhythm.  ?Abdominal:  ?   Tenderness: There is no abdominal tenderness.  ?  Musculoskeletal:  ?   Cervical back: Neck supple.  ?   Right lower leg: No edema.  ?   Left lower leg: No edema.  ?Skin: ?   General: Skin is warm.  ?   Capillary Refill: Capillary refill takes less than 2 seconds.  ?Neurological:  ?   Mental Status: She is alert and oriented to person, place, and time.  ? ? ?ED Results / Procedures / Treatments   ?Labs ?(all labs ordered are listed, but only abnormal results are displayed) ?Labs Reviewed  ?BASIC METABOLIC PANEL - Abnormal; Notable for the following components:  ?    Result Value  ? Glucose, Bld 102 (*)   ? Creatinine, Ser 1.14 (*)   ? GFR, Estimated 52 (*)   ? All other components within normal limits  ?CBC - Abnormal; Notable for the following components:  ? WBC 10.9 (*)   ? All  other components within normal limits  ?URINALYSIS, ROUTINE W REFLEX MICROSCOPIC - Abnormal; Notable for the following components:  ? APPearance HAZY (*)   ? Leukocytes,Ua TRACE (*)   ? Bacteria, UA RARE (*)   ? All other components within normal limits  ?CBG MONITORING, ED - Abnormal; Notable for the following components:  ? Glucose-Capillary 106 (*)   ? All other components within normal limits  ?CBG MONITORING, ED  ?TROPONIN I (HIGH SENSITIVITY)  ?TROPONIN I (HIGH SENSITIVITY)  ? ? ?EKG ?EKG Interpretation ? ?Date/Time:  Tuesday April 24 2021 17:49:06 EDT ?Ventricular Rate:  71 ?PR Interval:  170 ?QRS Duration: 88 ?QT Interval:  427 ?QTC Calculation: 464 ?R Axis:   -18 ?Text Interpretation: Sinus rhythm Inferior infarct, old Consider anterior infarct Confirmed by Davonna Belling [phone]) on 04/24/2021 6:03:54 PM ? ?Radiology ?DG Chest Portable 1 View ? ?Result Date: 04/24/2021 ?CLINICAL DATA:  Chest pain. EXAM: PORTABLE CHEST 1 VIEW COMPARISON:  Chest x-ray 02/01/2021. FINDINGS: The heart size and mediastinal contours are within normal limits. Both lungs are clear. The visualized skeletal structures are unremarkable. IMPRESSION: No active disease. Electronically Signed   By: Ronney Asters M.D.   On: 04/24/2021 18:36   ? ?Procedures ?Procedures  ? ? ?Medications Ordered in ED ?Medications - No data to display ? ?ED Course/ Medical Decision Making/ A&P ?  ?                        ?Medical Decision Making ?Amount and/or Complexity of Data Reviewed ?Labs: ordered. ?Radiology: ordered. ? ? ?Patient with near syncope.  Was walking the bathroom became dizzy.  Initial differential gnosis for near syncope is long and does include life-threatening conditions.  EKG reassuring.  Chest x-ray independently interpreted shows no pneumonia.  Lab work also reassuring.  Creatinine mildly elevated but appears to be at her baseline.  Not orthostatic.  Feels better after a small amount of fluid.  Troponin negative.  Do not feel that she  needs admission in the hospital at this time.  Potentially could be somewhat of a vagal event since she was on her way to the bathroom.  Appears stable for discharge and follow-up with the PCP.  Reviewed report from heart cath 2 years ago that showed normal coronarys. ? ? ? ? ? ? ? ?Final Clinical Impression(s) / ED Diagnoses ?Final diagnoses:  ?Near syncope  ? ? ?Rx / DC Orders ?ED Discharge Orders   ? ? None  ? ?  ? ? ?  ?Davonna Belling, MD ?04/24/21 2305 ? ?

## 2021-04-24 NOTE — ED Triage Notes (Signed)
Presents via EMS with c/o near syncope, states she was walking to bathroom, became dizzy and call for help, family assisted her to floor. Became sweaty. Squad gave her a 500cc bolus of 0.9 NS. ?

## 2022-11-03 IMAGING — DX DG CHEST 2V
2 series · 2 of 2 positions shown · non-contrast
Comparison: Chest radiograph dated 09/28/2020.

CLINICAL DATA: Chest pain.

EXAM:
CHEST - 2 VIEW

[chest pa]
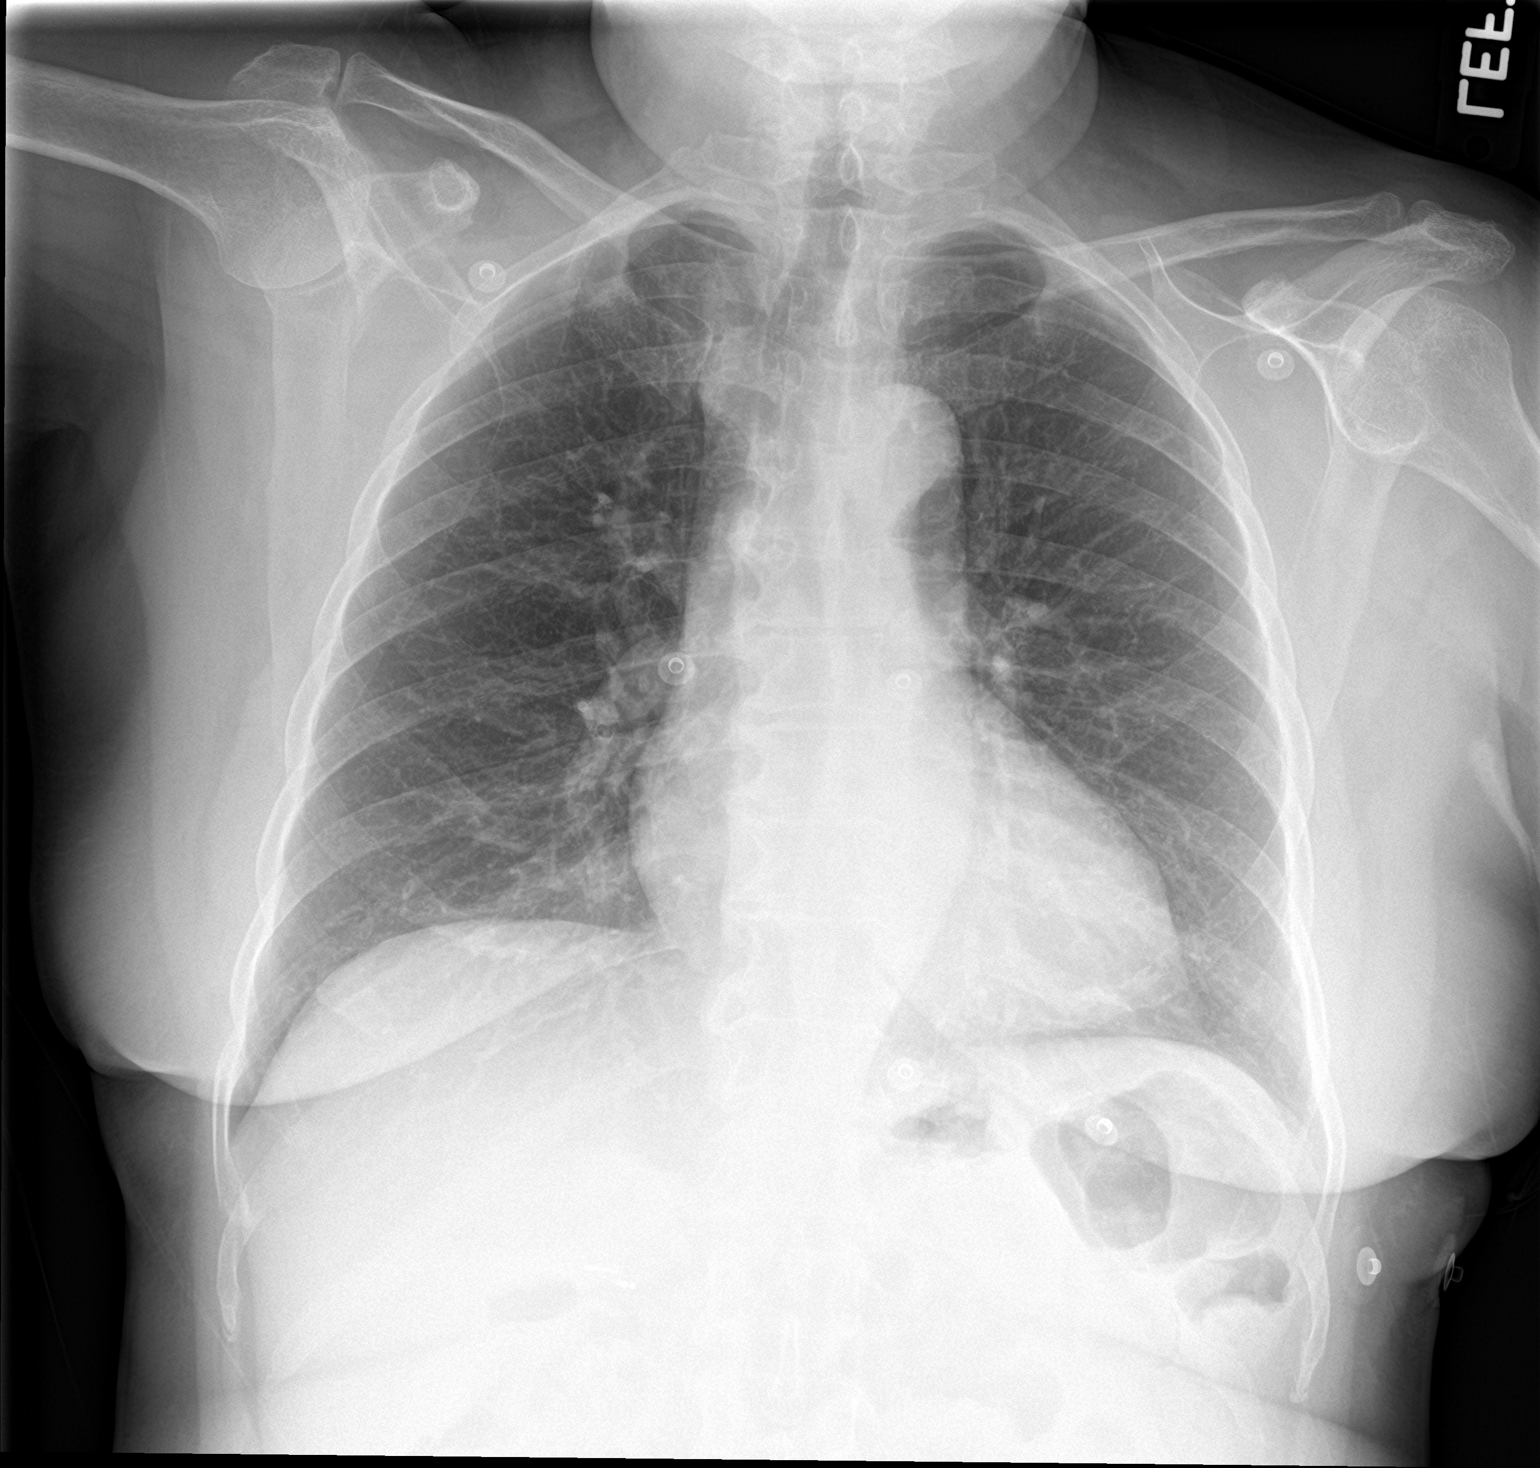

[chest lat]
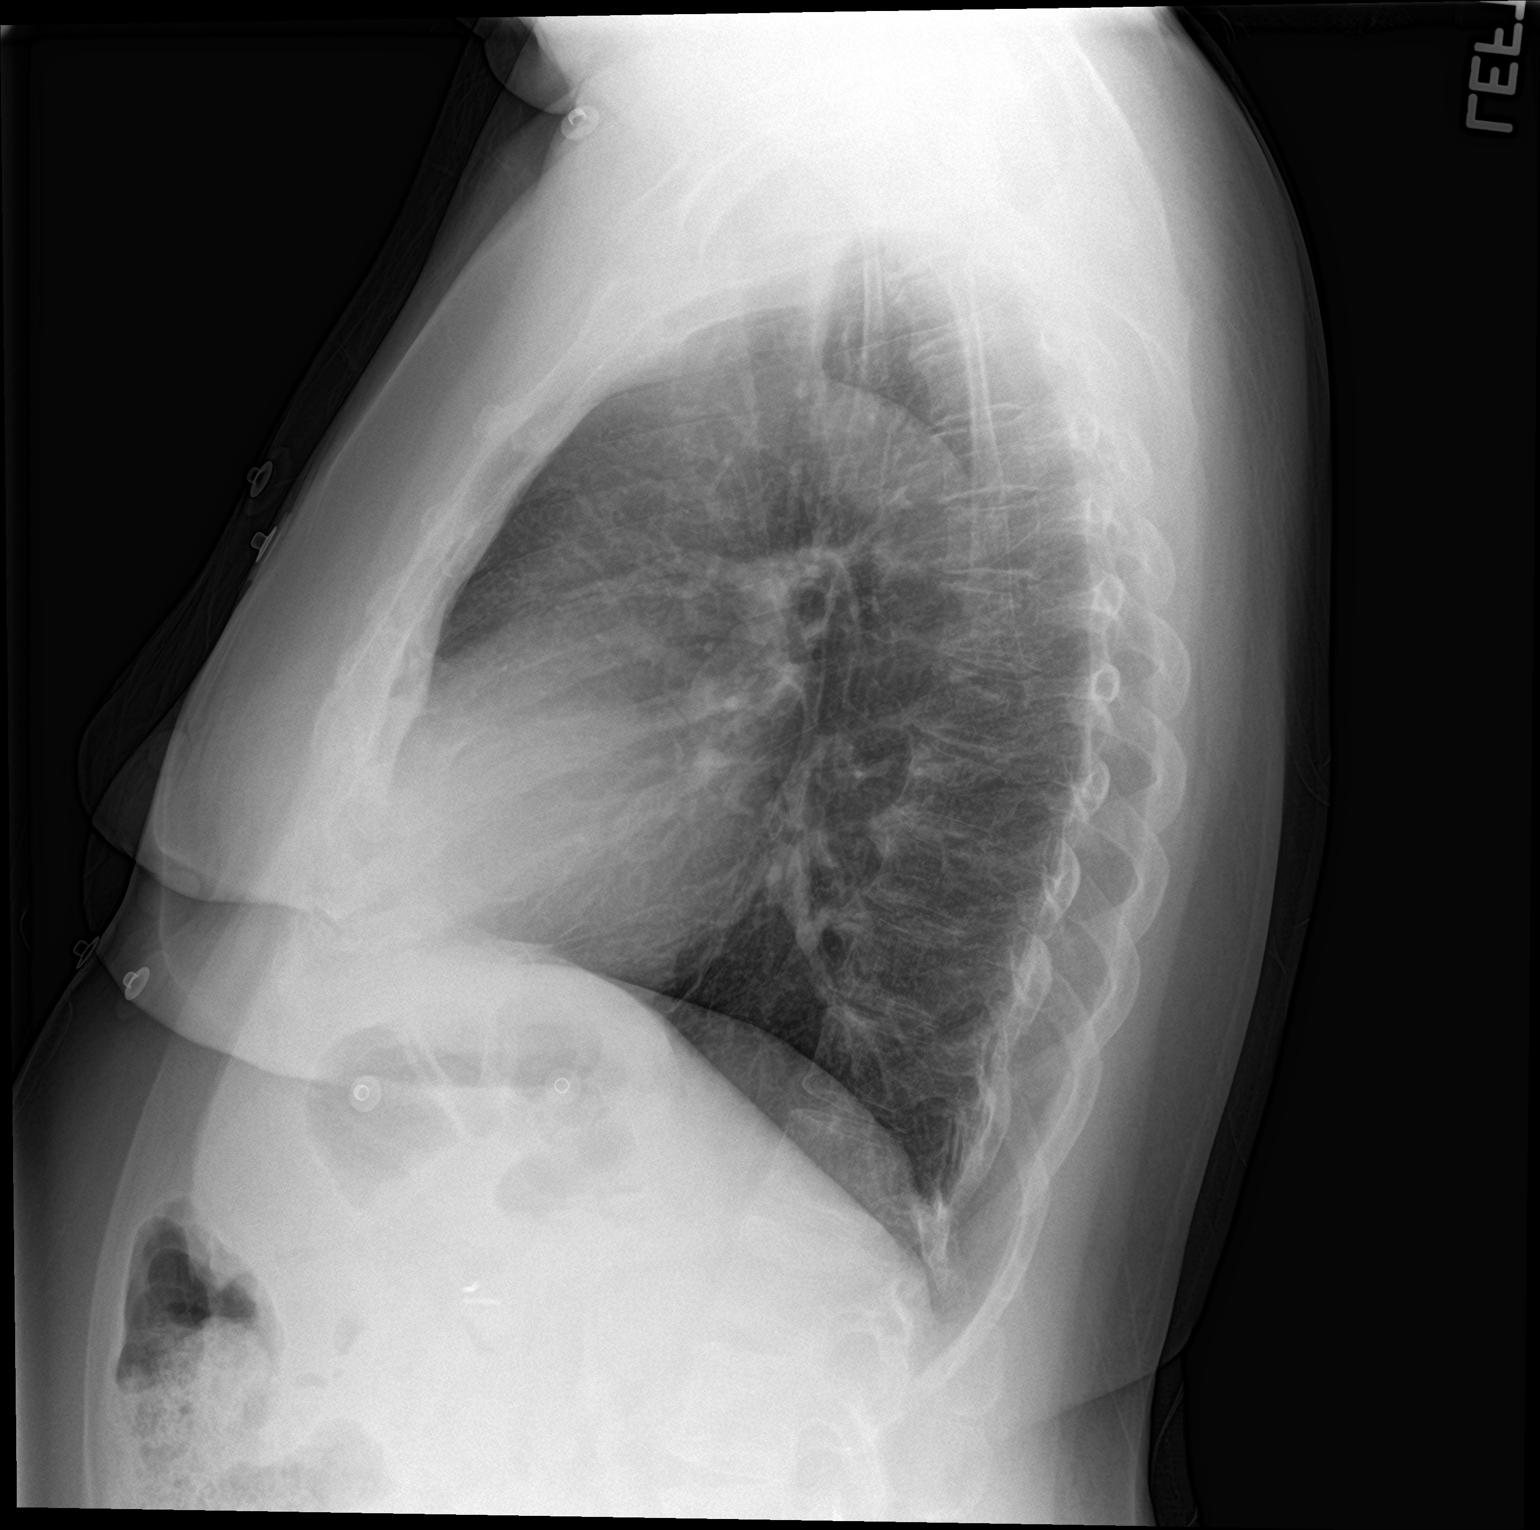

[2 of 2 positions shown; findings below may reference images not displayed]

FINDINGS: The heart size and mediastinal contours are within normal limits.
Both lungs are clear. The visualized skeletal structures are
unremarkable.
IMPRESSION: No active cardiopulmonary disease.

## 2023-12-08 ENCOUNTER — Ambulatory Visit

## 2023-12-08 ENCOUNTER — Other Ambulatory Visit: Payer: Self-pay

## 2023-12-08 VITALS — BP 130/70 | HR 68 | Ht 62.0 in | Wt 156.0 lb

## 2023-12-08 DIAGNOSIS — E119 Type 2 diabetes mellitus without complications: Secondary | ICD-10-CM | POA: Insufficient documentation

## 2023-12-08 DIAGNOSIS — E782 Mixed hyperlipidemia: Secondary | ICD-10-CM

## 2023-12-08 DIAGNOSIS — R002 Palpitations: Secondary | ICD-10-CM

## 2023-12-08 DIAGNOSIS — M810 Age-related osteoporosis without current pathological fracture: Secondary | ICD-10-CM | POA: Insufficient documentation

## 2023-12-08 DIAGNOSIS — K8020 Calculus of gallbladder without cholecystitis without obstruction: Secondary | ICD-10-CM | POA: Insufficient documentation

## 2023-12-08 DIAGNOSIS — E785 Hyperlipidemia, unspecified: Secondary | ICD-10-CM | POA: Insufficient documentation

## 2023-12-08 DIAGNOSIS — I10 Essential (primary) hypertension: Secondary | ICD-10-CM | POA: Insufficient documentation

## 2023-12-08 DIAGNOSIS — E1140 Type 2 diabetes mellitus with diabetic neuropathy, unspecified: Secondary | ICD-10-CM | POA: Insufficient documentation

## 2023-12-08 NOTE — Progress Notes (Signed)
 Cardiology Consultation:    Date:  12/08/2023   ID:  Stefanie Erickson, DOB 12/28/1950, MRN [id number]  PCP:  Montey Lot, PA-C  Cardiologist:  Alean SAUNDERS Maricela Schreur, MD   Referring MD: Montey Lot, PA-C   No chief complaint on file.    ASSESSMENT AND PLAN:   Ms. Stefanie Erickson 73 year old woman with history of diabetes mellitus, hypertension, hyperlipidemia, osteoporosis, 2 episodes of syncope [last episode couple years ago appears vasovagal in description]. Normal coronaries on cardiac cath 11/09/2018 [done in setting of dyspnea on exertion and abnormal stress myoview  test]. Echocardiogram 10/02/2018 noted normal biventricular function with LVEF 60 to 65%, no significant valve abnormality.   Here for the visit today to discuss palpitations.  Problem List Items Addressed This Visit     Hyperlipidemia   Well-controlled lipid panel from 08/04/2023. Continue current treatment with atorvastatin 40 mg once daily.       Hypertension   Well-controlled. She is notably on 3 medications amlodipine, metoprolol  tartrate, olmesartan and additional clonidine to be used as needed for systolic blood pressure greater than 180 mmHg.      Intermittent palpitations - Primary   - Not associated with any significant trigger or relieving factors and no syncopal episodes.  - Proceed with Zio patch monitor for 14 days. - Proceed with transthoracic echocardiogram to rule out any cardiac structural and functional abnormalities. Advised to keep herself well-hydrated. Advised to avoid any energy drinks       Relevant Orders   EKG 12-Lead (Completed)   ECHOCARDIOGRAM COMPLETE   Return to clinic tentatively in 3 months.   History of Present Illness:    Stefanie Erickson is a 73 y.o. female who is being seen today for the evaluation of palpitations at the request of Montey Lot, PA-C.   Pleasant woman here for visit today accompanied by her husband.  She is retired.  Manages her day-to-day  activities  Has history of diabetes mellitus, hypertension, hyperlipidemia, osteoporosis, 2 episodes of syncope [last episode couple years ago appears vasovagal in description]. Normal coronaries on cardiac cath 11/09/2018 [done in setting of dyspnea on exertion and abnormal stress myoview  test]. Echocardiogram 10/02/2018 noted normal biventricular function with LVEF 60 to 65%, no significant valve abnormality.  Mentions over the last 3 months she has been dealing with symptoms of palpitations mostly occurring at night before she falls asleep, 2-3 times a week she has been noticing these episodes can last up to 10 to 15 minutes, associated with a sense of fast heartbeat, irregular, and at times associated with a spasm like sensation in the back extending to her neck.  No syncopal or near syncopal episodes associated with these.  No obvious trigger or relieving factor.  Denies any chest pain, shortness of breath, orthopnea, paroxysmal nocturnal dyspnea. Able to do her day-to-day activities without any significant limitation. No pedal edema, claudication symptoms.  She reports couple episodes of syncope in the past.  No recent episode in over 2 years. Describes both episodes of syncope as occurring while using the restroom and trying to stand up.  Loss of consciousness with quick recovery.  Vasovagal in description.  Good compliance with medications. Notably she is on 3 medications for blood pressure with amlodipine,  metoprolol , olmesartan with clonidine available to take as needed  Does not smoke, does not drink alcohol, no illicit drug use. Occasionally drinks tea. No energy drinks.   EKG in the clinic today shows sinus rhythm heart rate 68/min, PR interval 168 ms, QRS  duration 72 ms, narrow Q waves in lead III aVF likely nonpathologic.  In comparison to prior EKG 04/24/2021, inferior and anteroseptal Q waves are new.  Blood work from 12/05/2023 BUN 17, creatinine 0.96, eGFR 63 Sodium 141,  potassium 4.1 Normal transaminases and alkaline phosphatase Hemoglobin 14.5, hematocrit 45.1, WBC 7.4, platelets 244 Lipid panel with total cholesterol 112, triglycerides 66, HDL 40 and LDL 58. No thyroid  panel available.  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 apex: Normal cardiac catheterization after that 11/04/2018   Atypical chest pain 09/25/2018   Diabetes mellitus without complication (HCC)    Dyspnea on exertion 09/25/2018   Essential hypertension 09/25/2018   Gall stones    Hyperlipidemia    Hypertension    Hypertension, essential, benign    Intermittent palpitations    Osteoporosis    Palpitations 09/25/2018   Type 2 diabetes mellitus without complication, without long-term current use of insulin (HCC) 09/25/2018   Type 2 diabetes, controlled, with neuropathy [facility])     Past Surgical History:  Procedure Laterality Date   CHOLECYSTECTOMY     LEFT HEART CATH AND CORONARY ANGIOGRAPHY N/A 11/09/2018   Procedure: LEFT HEART CATH AND CORONARY ANGIOGRAPHY;  Surgeon: Jordan, Peter M, MD;  Location: [facility];  Service: Cardiovascular;  Laterality: N/A;   TUBAL LIGATION      Current Medications: Current Meds  Medication Sig   alendronate (FOSAMAX) 70 MG tablet Take 70 mg by mouth once a week. Take with a full glass of water on an empty stomach.   amLODipine (NORVASC) 10 MG tablet Take 10 mg by mouth daily.   aspirin  EC 81 MG tablet Take 81 mg by mouth daily.   atorvastatin (LIPITOR) 40 MG tablet Take 40 mg by mouth daily.   cloNIDine (CATAPRES) 0.1 MG tablet Take 0.1 mg by mouth daily as needed (b/p > 180/100).   FARXIGA 10 MG TABS tablet Take 10 mg by mouth daily.   metFORMIN  (GLUCOPHAGE -XR) 500 MG 24 hr tablet Take 2,000 mg by mouth daily.   metoprolol  tartrate (LOPRESSOR ) 50 MG tablet Take 50 mg by mouth 2 (two) times daily.   olmesartan (BENICAR) 40 MG tablet Take 40 mg by mouth daily.   pregabalin (LYRICA) 150 MG  capsule Take 150 mg by mouth 2 (two) times daily.   [DISCONTINUED] fluticasone (FLONASE) 50 MCG/ACT nasal spray Place 2 sprays into both nostrils daily.     Allergies:   Nitroglycerin   Social History   Socioeconomic History   Marital status: Married    Spouse name: Not on file   Number of children: Not on file   Years of education: Not on file   Highest education level: Not on file  Occupational History   Not on file  Tobacco Use   Smoking status: Never   Smokeless tobacco: Never  Vaping Use   Vaping status: Never Used  Substance and Sexual Activity   Alcohol use: No   Drug use: No   Sexual activity: Not on file  Other Topics Concern   Not on file  Social History Narrative   Not on file   Social Drivers of Health   Financial Resource Strain: Not on file  Food Insecurity: Not on file  Transportation Needs: Not on file  Physical Activity: Not on file  Stress: Not on file  Social Connections: Not on file     Family History: The patient's family history includes COPD in her mother;  Colon cancer in her mother; Heart attack in her father; Heart disease in her mother; Renal Disease in her mother. ROS:   Please see the history of present illness.    All 14 point review of systems negative except as described per history of present illness.  EKGs/Labs/Other Studies Reviewed:    The following studies were reviewed today:   EKG:  EKG Interpretation Date/Time:  Monday December 08 2023 09:48:17 EST Ventricular Rate:  68 PR Interval:  168 QRS Duration:  72 QT Interval:  408 QTC Calculation: 433 R Axis:   -6  Text Interpretation: Normal sinus rhythm Inferior infarct , age undetermined Anterior infarct , age undetermined Abnormal ECG When compared with ECG of 24-Apr-2021 17:49, PREVIOUS ECG IS PRESENT Confirmed by Liborio Hai reddy [phone]) on 12/08/2023 10:00:38 AM    Recent Labs: No results found for requested labs within last 365 days.  Recent Lipid  Panel No results found for: CHOL, TRIG, HDL, CHOLHDL, VLDL, LDLCALC, LDLDIRECT  Physical Exam:    VS:  BP 130/70   Pulse 68   Ht 5' 2 (1.575 m)   Wt 156 lb (70.8 kg)   SpO2 97%   BMI 28.53 kg/m     Wt Readings from Last 3 Encounters:  12/08/23 156 lb (70.8 kg)  04/24/21 157 lb (71.2 kg)  12/09/18 162 lb (73.5 kg)     GENERAL:  Well nourished, well developed in no acute distress NECK: No JVD; No carotid bruits CARDIAC: RRR, S1 and S2 present, no murmurs, no rubs, no gallops CHEST:  Clear to auscultation without rales, wheezing or rhonchi  Extremities: No pitting pedal edema. Pulses bilaterally symmetric with radial 2+ and dorsalis pedis 2+ NEUROLOGIC:  Alert and oriented x 3  Medication Adjustments/Labs and Tests Ordered: Current medicines are reviewed at length with the patient today.  Concerns regarding medicines are outlined above.  Orders Placed This Encounter  Procedures   EKG 12-Lead   ECHOCARDIOGRAM COMPLETE   No orders of the defined types were placed in this encounter.   Signed, Hai jess Liborio, MD, MPH, [facility]. 12/08/2023 10:18 AM    [facility]

## 2023-12-08 NOTE — Assessment & Plan Note (Signed)
-   Not associated with any significant trigger or relieving factors and no syncopal episodes.  - Proceed with Zio patch monitor for 14 days. - Proceed with transthoracic echocardiogram to rule out any cardiac structural and functional abnormalities. Advised to keep herself well-hydrated. Advised to avoid any energy drinks

## 2023-12-08 NOTE — Assessment & Plan Note (Signed)
 Well-controlled. She is notably on 3 medications amlodipine, metoprolol  tartrate, olmesartan and additional clonidine to be used as needed for systolic blood pressure greater than 180 mmHg.

## 2023-12-08 NOTE — Assessment & Plan Note (Signed)
 Well-controlled lipid panel from 08/04/2023. Continue current treatment with atorvastatin 40 mg once daily.

## 2023-12-08 NOTE — Patient Instructions (Addendum)
 Medication Instructions:  Your physician recommends that you continue on your current medications as directed. Please refer to the Current Medication list given to you today.  *If you need a refill on your cardiac medications before your next appointment, please call your pharmacy*   Lab Work: None ordered If you have labs (blood work) drawn today and your tests are completely normal, you will receive your results only by: MyChart Message (if you have MyChart) OR A paper copy in the mail If you have any lab test that is abnormal or we need to change your treatment, we will call you to review the results.  Testing/Procedures: Your physician has requested that you have an echocardiogram. Echocardiography is a painless test that uses sound waves to create images of your heart. It provides your doctor with information about the size and shape of your heart and how well your heart's chambers and valves are working. This procedure takes approximately one hour. There are no restrictions for this procedure. Please do NOT wear cologne, perfume, aftershave, or lotions (deodorant is allowed). Please arrive 15 minutes prior to your appointment time.  Please note: We ask at that you not bring children with you during ultrasound (echo/ vascular) testing. Due to room size and safety concerns, children are not allowed in the ultrasound rooms during exams. Our front office staff cannot provide observation of children in our lobby area while testing is being conducted. An adult accompanying a patient to their appointment will only be allowed in the ultrasound room at the discretion of the ultrasound technician under special circumstances. We apologize for any inconvenience.  A zio monitor was ordered today. It will remain on for 14 days. Remove 12/22/2023. You will then return monitor and event diary in provided box. It takes 1-2 weeks for report to be downloaded and returned to us . We will call you with the  results. If monitor falls off or has orange flashing light, please call Zio for further instructions.   Follow-Up: At [facility], you and your health needs are our priority.  As part of our continuing mission to provide you with exceptional heart care, we have created designated Provider Care Teams.  These Care Teams include your primary Cardiologist (physician) and Advanced Practice Providers (APPs -  Physician Assistants and Nurse Practitioners) who all work together to provide you with the care you need, when you need it.  We recommend signing up for the patient portal called MyChart.  Sign up information is provided on this After Visit Summary.  MyChart is used to connect with patients for Virtual Visits (Telemedicine).  Patients are able to view lab/test results, encounter notes, upcoming appointments, etc.  Non-urgent messages can be sent to your provider as well.   To learn more about what you can do with MyChart, go to [email or web address].    Your next appointment:   3 month(s)  The format for your next appointment:   In Person  Provider:   Alean Madireddy, MD   Other Instructions Echocardiogram An echocardiogram is a test that uses sound waves (ultrasound) to produce images of the heart. Images from an echocardiogram can provide important information about: Heart size and shape. The size and thickness and movement of your heart's walls. Heart muscle function and strength. Heart valve function or if you have stenosis. Stenosis is when the heart valves are too narrow. If blood is flowing backward through the heart valves (regurgitation). A tumor or infectious growth around the heart valves. Areas of heart  muscle that are not working well because of poor blood flow or injury from a heart attack. Aneurysm detection. An aneurysm is a weak or damaged part of an artery wall. The wall bulges out from the normal force of blood pumping through the body. Tell a health care  provider about: Any allergies you have. All medicines you are taking, including vitamins, herbs, eye drops, creams, and over-the-counter medicines. Any blood disorders you have. Any surgeries you have had. Any medical conditions you have. Whether you are pregnant or may be pregnant. What are the risks? Generally, this is a safe test. However, problems may occur, including an allergic reaction to dye (contrast) that may be used during the test. What happens before the test? No specific preparation is needed. You may eat and drink normally. What happens during the test? You will take off your clothes from the waist up and put on a hospital gown. Electrodes or electrocardiogram (ECG)patches may be placed on your chest. The electrodes or patches are then connected to a device that monitors your heart rate and rhythm. You will lie down on a table for an ultrasound exam. A gel will be applied to your chest to help sound waves pass through your skin. A handheld device, called a transducer, will be pressed against your chest and moved over your heart. The transducer produces sound waves that travel to your heart and bounce back (or echo back) to the transducer. These sound waves will be captured in real-time and changed into images of your heart that can be viewed on a video monitor. The images will be recorded on a computer and reviewed by your health care provider. You may be asked to change positions or hold your breath for a short time. This makes it easier to get different views or better views of your heart. In some cases, you may receive contrast through an IV in one of your veins. This can improve the quality of the pictures from your heart. The procedure may vary among health care providers and hospitals.   What can I expect after the test? You may return to your normal, everyday life, including diet, activities, and medicines, unless your health care provider tells you not to do that. Follow  these instructions at home: It is up to you to get the results of your test. Ask your health care provider, or the department that is doing the test, when your results will be ready. Keep all follow-up visits. This is important. Summary An echocardiogram is a test that uses sound waves (ultrasound) to produce images of the heart. Images from an echocardiogram can provide important information about the size and shape of your heart, heart muscle function, heart valve function, and other possible heart problems. You do not need to do anything to prepare before this test. You may eat and drink normally. After the echocardiogram is completed, you may return to your normal, everyday life, unless your health care provider tells you not to do that. This information is not intended to replace advice given to you by your health care provider. Make sure you discuss any questions you have with your health care provider. Document Revised: 08/31/2019 Document Reviewed: 08/31/2019 Elsevier Patient Education  2021 [employer]   Important Information About Sugar

## 2023-12-09 ENCOUNTER — Ambulatory Visit

## 2023-12-09 DIAGNOSIS — R002 Palpitations: Secondary | ICD-10-CM

## 2023-12-09 LAB — ECHOCARDIOGRAM COMPLETE
AR max vel: 2.66 cm2
AV Area VTI: 2.56 cm2
AV Area mean vel: 2.6 cm2
AV Mean grad: 2.9 mmHg
AV Peak grad: 6.7 mmHg
Ao pk vel: 1.29 m/s
Area-P 1/2: 2.61 cm2
S' Lateral: 2.6 cm

## 2023-12-29 ENCOUNTER — Ambulatory Visit: Payer: Self-pay

## 2023-12-29 DIAGNOSIS — R002 Palpitations: Secondary | ICD-10-CM | POA: Diagnosis not present

## 2024-03-09 ENCOUNTER — Ambulatory Visit
